# Patient Record
Sex: Male | Born: 1965 | Race: White | Hispanic: No | Marital: Married | Smoking: Never smoker
Health system: Southern US, Community
[De-identification: ages and names within clinical notes are randomized; demographics above are authoritative.]

## PROBLEM LIST (undated history)

## (undated) DIAGNOSIS — F419 Anxiety disorder, unspecified: Secondary | ICD-10-CM

## (undated) DIAGNOSIS — F32A Depression, unspecified: Secondary | ICD-10-CM

## (undated) DIAGNOSIS — M1990 Unspecified osteoarthritis, unspecified site: Secondary | ICD-10-CM

## (undated) DIAGNOSIS — F329 Major depressive disorder, single episode, unspecified: Secondary | ICD-10-CM

## (undated) DIAGNOSIS — K219 Gastro-esophageal reflux disease without esophagitis: Secondary | ICD-10-CM

## (undated) HISTORY — PX: OTHER SURGICAL HISTORY: SHX169

## (undated) HISTORY — PX: COLONOSCOPY: SHX174

## (undated) HISTORY — DX: Unspecified osteoarthritis, unspecified site: M19.90

## (undated) HISTORY — DX: Major depressive disorder, single episode, unspecified: F32.9

## (undated) HISTORY — DX: Gastro-esophageal reflux disease without esophagitis: K21.9

## (undated) HISTORY — DX: Depression, unspecified: F32.A

---

## 1998-10-27 ENCOUNTER — Encounter: Payer: Self-pay | Admitting: Emergency Medicine

## 1998-10-27 ENCOUNTER — Emergency Department (HOSPITAL_COMMUNITY): Admission: EM | Admit: 1998-10-27 | Discharge: 1998-10-27 | Payer: Self-pay | Admitting: Emergency Medicine

## 2002-03-06 ENCOUNTER — Encounter: Payer: Self-pay | Admitting: Emergency Medicine

## 2002-03-06 ENCOUNTER — Emergency Department (HOSPITAL_COMMUNITY): Admission: EM | Admit: 2002-03-06 | Discharge: 2002-03-06 | Payer: Self-pay | Admitting: Emergency Medicine

## 2007-01-11 ENCOUNTER — Emergency Department (HOSPITAL_COMMUNITY): Admission: EM | Admit: 2007-01-11 | Discharge: 2007-01-11 | Payer: Self-pay | Admitting: Emergency Medicine

## 2008-06-03 ENCOUNTER — Emergency Department (HOSPITAL_COMMUNITY): Admission: EM | Admit: 2008-06-03 | Discharge: 2008-06-04 | Payer: Self-pay | Admitting: Emergency Medicine

## 2008-06-04 ENCOUNTER — Inpatient Hospital Stay (HOSPITAL_COMMUNITY): Admission: AD | Admit: 2008-06-04 | Discharge: 2008-06-05 | Payer: Self-pay | Admitting: *Deleted

## 2008-06-07 ENCOUNTER — Ambulatory Visit: Payer: Self-pay | Admitting: *Deleted

## 2008-07-02 ENCOUNTER — Observation Stay (HOSPITAL_COMMUNITY): Admission: EM | Admit: 2008-07-02 | Discharge: 2008-07-05 | Payer: Self-pay | Admitting: Emergency Medicine

## 2008-07-05 ENCOUNTER — Inpatient Hospital Stay (HOSPITAL_COMMUNITY): Admission: RE | Admit: 2008-07-05 | Discharge: 2008-07-07 | Payer: Self-pay | Admitting: *Deleted

## 2008-07-05 ENCOUNTER — Ambulatory Visit: Payer: Self-pay | Admitting: *Deleted

## 2010-06-03 ENCOUNTER — Emergency Department (HOSPITAL_COMMUNITY): Admission: EM | Admit: 2010-06-03 | Discharge: 2010-06-03 | Payer: Self-pay | Admitting: Emergency Medicine

## 2010-11-25 ENCOUNTER — Emergency Department (HOSPITAL_BASED_OUTPATIENT_CLINIC_OR_DEPARTMENT_OTHER)
Admission: EM | Admit: 2010-11-25 | Discharge: 2010-11-25 | Payer: Self-pay | Source: Home / Self Care | Admitting: Emergency Medicine

## 2011-01-24 ENCOUNTER — Emergency Department (HOSPITAL_COMMUNITY)
Admission: EM | Admit: 2011-01-24 | Discharge: 2011-01-24 | Disposition: A | Discharge status: Home / Self Care | Payer: Medicare Other | Attending: Emergency Medicine | Admitting: Emergency Medicine

## 2011-01-24 ENCOUNTER — Emergency Department (HOSPITAL_COMMUNITY): Payer: Medicare Other

## 2011-01-24 DIAGNOSIS — R0602 Shortness of breath: Secondary | ICD-10-CM | POA: Insufficient documentation

## 2011-01-24 DIAGNOSIS — F3130 Bipolar disorder, current episode depressed, mild or moderate severity, unspecified: Secondary | ICD-10-CM | POA: Insufficient documentation

## 2011-01-24 DIAGNOSIS — R61 Generalized hyperhidrosis: Secondary | ICD-10-CM | POA: Insufficient documentation

## 2011-01-24 DIAGNOSIS — Z79899 Other long term (current) drug therapy: Secondary | ICD-10-CM | POA: Insufficient documentation

## 2011-01-24 DIAGNOSIS — R209 Unspecified disturbances of skin sensation: Secondary | ICD-10-CM | POA: Insufficient documentation

## 2011-01-24 DIAGNOSIS — R079 Chest pain, unspecified: Secondary | ICD-10-CM | POA: Insufficient documentation

## 2011-01-24 LAB — CBC
HCT: 42.6 % (ref 39.0–52.0)
Hemoglobin: 15.4 g/dL (ref 13.0–17.0)
MCV: 89.1 fL (ref 78.0–100.0)
RDW: 13.2 % (ref 11.5–15.5)
WBC: 6.8 10*3/uL (ref 4.0–10.5)

## 2011-01-24 LAB — COMPREHENSIVE METABOLIC PANEL
ALT: 85 U/L — ABNORMAL HIGH (ref 0–53)
Alkaline Phosphatase: 71 U/L (ref 39–117)
BUN: 12 mg/dL (ref 6–23)
CO2: 25 mEq/L (ref 19–32)
Chloride: 101 mEq/L (ref 96–112)
GFR calc non Af Amer: >60 mL/min (ref >60)
Glucose, Bld: 100 mg/dL — ABNORMAL HIGH (ref 70–99)
Potassium: 4.5 mEq/L (ref 3.5–5.1)
Sodium: 137 mEq/L (ref 135–145)
Total Bilirubin: 0.7 mg/dL (ref 0.3–1.2)
Total Protein: 7 g/dL (ref 6.0–8.3)

## 2011-01-24 LAB — DIFFERENTIAL
Eosinophils Relative: 3 % (ref 0–5)
Lymphocytes Relative: 41 % (ref 12–46)
Lymphs Abs: 2.8 10*3/uL (ref 0.7–4.0)
Monocytes Absolute: 0.6 10*3/uL (ref 0.1–1.0)
Neutro Abs: 3.3 10*3/uL (ref 1.7–7.7)

## 2011-01-24 LAB — POCT CARDIAC MARKERS
Myoglobin, poc: 103 ng/mL (ref 12–200)
Troponin i, poc: <0.05 ng/mL (ref 0.00–0.09)

## 2011-01-24 MED ORDER — IOHEXOL 350 MG/ML SOLN: 75.0000 mL | Freq: Once | INTRAVENOUS | Status: DC | PRN

## 2011-03-09 ENCOUNTER — Emergency Department (HOSPITAL_COMMUNITY)
Admission: EM | Admit: 2011-03-09 | Discharge: 2011-03-09 | Disposition: A | Discharge status: Home / Self Care | Payer: Medicare Other | Attending: Emergency Medicine | Admitting: Emergency Medicine

## 2011-03-09 ENCOUNTER — Emergency Department (HOSPITAL_COMMUNITY): Payer: Medicare Other

## 2011-03-09 DIAGNOSIS — R51 Headache: Secondary | ICD-10-CM | POA: Insufficient documentation

## 2011-03-09 DIAGNOSIS — S0003XA Contusion of scalp, initial encounter: Secondary | ICD-10-CM | POA: Insufficient documentation

## 2011-03-09 DIAGNOSIS — S0993XA Unspecified injury of face, initial encounter: Secondary | ICD-10-CM | POA: Insufficient documentation

## 2011-03-09 DIAGNOSIS — Z79899 Other long term (current) drug therapy: Secondary | ICD-10-CM | POA: Insufficient documentation

## 2011-03-09 DIAGNOSIS — Y990 Civilian activity done for income or pay: Secondary | ICD-10-CM | POA: Insufficient documentation

## 2011-03-09 DIAGNOSIS — R220 Localized swelling, mass and lump, head: Secondary | ICD-10-CM | POA: Insufficient documentation

## 2011-03-09 DIAGNOSIS — Y9229 Other specified public building as the place of occurrence of the external cause: Secondary | ICD-10-CM | POA: Insufficient documentation

## 2011-03-09 DIAGNOSIS — F3130 Bipolar disorder, current episode depressed, mild or moderate severity, unspecified: Secondary | ICD-10-CM | POA: Insufficient documentation

## 2011-04-09 NOTE — Discharge Summary (Signed)
[name] Jenkins, [name] Jenkins                ACCOUNT NO.:  [id number]   MEDICAL RECORD NO.:  [id number]          PATIENT TYPE:  INP   LOCATION:  1514                         FACILITY:  [facility]   PHYSICIAN:  Peggye Pitt, M.D. DATE OF BIRTH:  1966/11/18   DATE OF ADMISSION:  07/02/2008  DATE OF DISCHARGE:  07/05/2008                               DISCHARGE SUMMARY   DISCHARGE DIAGNOSES:  1. Altered mental status secondary to intentional drug overdose.  2. Suicide attempt by the way of intentional drug overdose of      Seroquel, Valium and Ambien.  3. Alcohol abuse.   DISCHARGE MEDICATIONS:  To be determined by inpatient Psychiatry.   DISPOSITION:  The patient will be transferred to [facility] for inpatient psychiatric treatment.   CONSULTATIONS:  None.   PROCEDURE:  A chest x-ray on July 02, 2008 that shows minimal bibasilar  atelectasis.   HISTORY AND PHYSICAL EXAM:  For full details, please refer to the chart,  but in brief [name] Jenkins is a 45 year old Caucasian man who took an  overdose of Valium, Ambien and Seroquel.  His wife had kicked him out of  the house that day.  They have been having some marital problems and he  was very depressed about that.  He also lost his  job which is another  added stressor.  All of this lead to his intentional drug overdose.   HOSPITAL COURSE:  1. Altered mental status, likely secondary to his drug overdose:      Resolved at time of discharge.  2. Suicide attempt by the way of intentional drug overdose of Valium,      Ambien and Seroquel:  He has had no further complications from      this.  He is medically cleared for discharge.  We had requested a      Psychiatry evaluation and they have recommended inpatient      psychiatric admission to the [facility] for further      evaluation and treatment as they do not feel he would be safe if he      were to be discharged home.  Other than his psychiatric issues, no  other problems were active during this hospitalization.   DISCHARGE VITAL SIGNS:  Blood pressure 116/65, heart rate 71,  respirations 20, 02 saturation 93% on room air with a temperature of  97.9.      Peggye Pitt, M.D.  Electronically Signed     EH/MEDQ  D:  07/05/2008  T:  07/05/2008  Job:  [id number]

## 2011-04-09 NOTE — H&P (Signed)
[name] Jenkins, AXEL                ACCOUNT NO.:  [id number]   MEDICAL RECORD NO.:  [id number]          PATIENT TYPE:  INP   LOCATION:  1236                         FACILITY:  [facility]   PHYSICIAN:  Herbie Saxon, MDDATE OF BIRTH:  [birth year]   DATE OF ADMISSION:  07/02/2008  DATE OF DISCHARGE:  06/05/2008                              HISTORY & PHYSICAL   PRESENTING COMPLAINTS:  1. Reduced level of consciousness.  2. Intentional drug overdose.  3. Alcohol abuse.  4. Garbled speech 1 day.   HISTORY OF PRESENTING COMPLAINT:  This is a 45 year old male with past  history of psychiatric disease, Dilaudid dependence, anxiety,  personality disorder, who was recently discharged from inpatient  psychiatry care on June 05, 2008 after intentional Xanax and Seroquel  overdose of June 04, 2008.  Was brought in by EMT and the sheriff early  this morning.  The patient had been drinking gin and he took an  unspecified number of pills, likely Seroquel and benzodiazepine.  He  left a suicide note.  His speech was garbled, but the patient was  verbalizing the intention to harm himself.  Presently obtunded and  unable to give any history.  Family members are not able to add or  corroborate any more history.  Old chart and ER chart reviewed.   PAST MEDICAL HISTORY:  As stated above.   PAST SURGICAL HISTORY:  Not available.   SOCIAL HISTORY:  He lives with relatives.  Documented history of  domestic stress with his wife.   REVIEW OF SYSTEMS:  Not available.   ALLERGIES:  BUSPAR.   MEDICATIONS:  1. Seroquel 50 mg daily.  2. Diazepam 10 mg q.6 h.  3. Alprazolam 2 mg p.r.n.  4. Seroquel 100 mg at night.  5. Zolpidem 1 tablet at night p.r.n.   PHYSICAL EXAMINATION:  GENERAL:  He is a young man poorly arousable to  noxious stimuli.  HEENT:  Pupils are constricted, sluggishly reactive.  Head is  atraumatic, normocephalic.  Oropharynx and pharynx are clear.  VITAL SIGNS:  Temperature 98,  pulse 96, respiratory rate 11, blood  pressure 117/72.  NECK:  Supple.  No adenopathy or JVD.  CHEST:  Clinically clear.  No rales or rhonchi.  HEART:  Sounds 1 and 2, regular rate and rhythm.  No murmurs, rubs or  gallops.  ABDOMEN:  Soft, nontender.  No organomegaly.  Inguinal orifices are  patent.  EXTREMITIES:  Deep tendon reflexes are 2 globally.  Generalized  hypertonia.  Babinski's upgoing.  No pedal edema.  No erythema.  No  cyanosis or clubbing.  NEURO:  Cranial nerves and sensory could not be tested.   LABORATORY DATA:  AST 25, ALT 22, albumin 0.9, alcohol less than 5.  Urine tox positive for benzodiazepines.   DIAGNOSTICS:  EKG:  Normal sinus rhythm at 87 per minute.   ASSESSMENT:  1. Altered mental status.  2. Suicide attempt.  3. Substance abuse.  4. Alcohol abuse.  5. Seroquel overdose.   PLAN:  The patient will be admitted to the Intensive Care Unit.  He is  to be on suicide precautions, 1:1 observation sitter, fall, aspiration,  seizure precautions, watch for DTs, oxygen 2-4 liters nasal cannula  p.r.n. supplementally, n.p.o. for now, IV fluid D5 NSat an hour  with thiamine and folate added to IV fluid.  Check his BMP, calcium,  magnesium, phosphorus, coagulation parameters, urinalysis, chest x-ray.  He is to be on Lovenox 40 mg subcutaneous daily for DVT prophylaxis,  Continue protonix 40mg  IV daily, Proventil 1 unit dose q.6 h p.r.n.  Psych evaluation for psych placement. EKG q.6 h. x2.  substance abuse  counseling when patient is able.   PRIMARY CARE PHYSICIAN:  Dr. Tally Due, MD  Electronically Signed     MIO/MEDQ  D:  07/02/2008  T:  07/02/2008  Job:  [id number]

## 2011-04-09 NOTE — Discharge Summary (Signed)
NAMECYRUS, [name] Jenkins                ACCOUNT NO.:  [id number]   MEDICAL RECORD NO.:  [id number]          PATIENT TYPE:  IPS   LOCATION:  0304                          [facility]  [facility]   PHYSICIAN:  Jasmine Pang, M.D. DATE OF BIRTH:  09-Dec-1965   DATE OF ADMISSION:  07/05/2008  DATE OF DISCHARGE:  07/07/2008                               DISCHARGE SUMMARY   IDENTIFICATION:  This is a 45 year old married white male who was  admitted on a voluntary basis on July 05, 2008.   HISTORY OF PRESENT ILLNESS:  The patient presents after an intentional  overdose on Seroquel, Xanax, and Valium tablets, he took approximately  six 50 mg Valium tablets and an unclear amount of Seroquel and 8 or 9  Xanax tablets and some Ambien.  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  His intention was to kill himself.  He  was having conflict with his wife.  They have been separated for about a  month after the patient had an affair.  The patient wants things to be  fixed with his wife sooner rather than later.  He states he was not  invited to his son's birthday party, which got him upset and sad and was  one of the triggers to his overdose.  He is currently denying any  suicidal thoughts.  He is glad things work down so that he was found.  This occurred when the police found his car behind the restaurant and  got, took him, are called the EMS or EMTs.  He is still very anxious  about trying to repair things with his wife.  He has been sleeping  otherwise well on his medications.  He denies any current alcohol or  drug use.  The patient was fully assessed at [facility] where  he was hospitalized for approximately 4 days until medically stable.   PAST PSYCHIATRIC HISTORY:  The patient was here in June 2009 for an  overdose on Xanax and Seroquel.  He states it was an accidental  overdose.  He sees  Dr. Evelene Croon for outpatient psychiatric services.  He  also has a therapist named Elita Quick in the past.  The patient has  been on Mefoxin and Cymbalta, which he states have both worsened his  mood.   FAMILY HISTORY:  The patient's mother has anxiety.   ALCOHOL AND DRUG HISTORY:  The patient denies any recent alcohol or drug  use.   MEDICAL PROBLEMS:  There were no acute or chronic health issues.   MEDICATIONS:  1. He has been on Seroquel 500 mg p.o. q.h.s.  2. Valium 10 mg t.i.d.  3. Ambien 10 mg q.h.s. p.r.n. insomnia.  4. Xanax 2 mg, he can take up to 30 in a month.   DRUG ALLERGIES:  No known drug allergies.   PHYSICAL FINDINGS:  There were no acute physical or medical problems  noted.  He was fully assessed at [facility]  where he was  admitted after his overdose.   ADMISSION LABORATORIES:  Laboratory data shows an alcohol level less  than 5.  Salicylate level less than 4.  Acetaminophen level less than  10.  Urine drug screen was positive for benzodiazepines.  Liver enzymes  were within normal limits.  Potassium 3.4.   HOSPITAL COURSE:  Upon admission, the patient was started on trazodone  100 mg p.o. q.h.s. p.r.n. insomnia.  He was also started on Librium 25  mg p.o. q.6 hours p.r.n. symptoms of withdrawal (instead of placing him  back on the Valium).  He was also placed on ibuprofen 800 mg p.o. t.i.d.  p.r.n. pain.  On July 05, 2008, he was started on Ambien 10 mg p.o.  q.h.s. p.r.n. insomnia and Seroquel 400 mg p.o. q.h.s.  The patient  tolerated these medications well with no significant side effects.  The  patient discussed his regret.  In individual sessions with me, the  patient was friendly and cooperative.  He also attended unit therapeutic  groups and activities.  The patient discussed his regrets and having the  affair.  He is very anxious for his wife to take him back, but aware  that she is going to need sometime.  He states he took the overdose  due  to separation from his wife and not being invited to his son's birthday  party.  This is the second hospitalization here for the patient.  He was  here 4 to 5 weeks ago.  He has been depressed and anxious.  On July 07, 2008, mental status had improved markedly from admission status.  The patient's mood was less depressed, less anxious.  Affect was  consistent with mood.  There was no suicidal or homicidal ideation.  No  thoughts of self-injurious behavior.  No auditory or visual  hallucinations.  No paranoia or delusions.  Thoughts were logical and  goal-directed.  Thought content.  No predominant theme.  Cognitive was  grossly intact.  Judgment was good.  Insight was good.  Impulse level  was within normal limits.  It was felt the patient was ready for  discharge today and the patient stated he was safe and had no intention  of trying to harm himself.   DISCHARGE DIAGNOSES:  Axis I:  Mood disorder, not otherwise specified.  Axis II:  Features of personality disorder, not otherwise specified.  Axis III:  No known medical conditions.  He is status post overdose on  Seroquel and benzodiazepines, but medically stable now.  Axis IV: Severe  (problems with his primary support group including separation from his  wife and alienation from many of his family members after his affair,  burden of psychiatric illness, occupational since he worked at [employer] own by his wife before they separated).  Axis V: Global assessment of functioning was 50 upon discharge.  GAF was  35 upon admission.  GAF highest past year was 65.   DISCHARGE PLANS:  There was no specific activity level or dietary  restrictions.   POSTHOSPITAL CARE PLANS:  The patient will see Dr. Evelene Croon, his  psychiatrist on August 14th at 9 a.m.  He will also see his therapist  Elita Quick, she is going to call him to schedule the appointment.   DISCHARGE MEDICATIONS:  1. Seroquel XR 400 mg at 7 p.m.  2. Ambien 10 mg at  bedtime p.r.n. insomnia.  3. He was requested to speak with Dr. Evelene Croon about restarting the  Valium      and Xanax and Ambien since she had been detoxed off these during      the hospitalization.      Jasmine Pang, M.D.  Electronically Signed     BHS/MEDQ  D:  07/07/2008  T:  07/07/2008  Job:  [id number]

## 2011-04-09 NOTE — H&P (Signed)
[name] Jenkins, SWOPES                ACCOUNT NO.:  [id number]   MEDICAL RECORD NO.:  [id number]          PATIENT TYPE:  IPS   LOCATION:  0600                          [facility]ACILITY:  [facility]   PHYSICIAN:  Jasmine Pang, M.D. DATE OF BIRTH:  04/13/1966   DATE OF ADMISSION:  06/04/2008  DATE OF DISCHARGE:                       PSYCHIATRIC ADMISSION ASSESSMENT   This is a voluntary admission to the services of Dr. Milford Cage.   IDENTIFYING INFORMATION:  This is a 45 year old, married, white male.  The [facility] Emergency Department stated that per EMS the patient had  an intentional overdose of 25, 2 mg Xanax, 10 Seroquel; these were 50 mg  apiece; five 10 mg Valium, and this was after having a domestic dispute  with his wife.  There was no prior suicidal ideation and there has been  no prior attempt at overdose.   PAST PSYCHIATRIC HISTORY:  The patient is currently under the care of  Dr. Milagros Evener.  About a year or so ago, he had a back injury.  He  was started initially on Dilaudid and Valium.  After about 3 months, he  realized he was actually becoming dependent on the Dilaudid meaning that  he would take it even though he was not having pain.  He discussed this  with the prescriber, who immediately stopped the Dilaudid and just gave  him ibuprofen.  A few weeks later, he felt very anxious and actually  having withdrawal, and he was seen in his primary care physician's  office, Dr. Herb Grays, started on Luvox, and after about a month he  felt depressed so they had to stop that.  He went through a variety of  med changes and currently has had the best response to Seroquel and  occasional Xanax.  He started out at 50 mg at night and this has  gradually been worked up to 350 mg at h.s., and he has Xanax 2 mg b.i.d.  that he uses occasionally.  He also has Ambien 10 mg at h.s.  All of  this is prescribed by Dr. Collins Scotland.  Back in June, Dr. Evelene Croon re-prescribed  Valium 10 mg q.i.d.  The  patient states that he was imprisoned at age  22, he had to go to prison for 15 years, and Dr. Evelene Croon feels that he will  likely as not need to take the Valium and the Seroquel indefinitely.   SOCIAL HISTORY:  He is a high Garment/textile technologist in 1986.  This is his  third marriage.  He has been in this marriage for 2 years.  He has 5  biological children, a son 15, a daughter 85 from his first marriage.  From his second marriage, a 71-year-old son, a 23-year-old daughter.  He  has 50% custody of these children.  He has a 17-year-old son by his  present wife.  He is employed as a Electrical engineer.   FAMILY HISTORY:  He denies.   ALCOHOL AND DRUG HISTORY:  He denies.   PRIMARY CARE Annalyn Blecher:  Dr. Herb Grays, and he is currently under  psychiatric care with Dr.  Evelene Croon and also sees a therapist.   MEDICAL PROBLEMS:  He denies at this time.   MEDICATIONS:  He is currently prescribed:  1. Ambien 10 mg p.o. at h.s.  2. Seroquel, he is now up to 350 mg at h.s.  3. Xanax 2 mg, one p.o. b.i.d. p.r.n.  4. Valium 10 mg p.o. q.i.d.  5. Ibuprofen 800 mg t.i.d. p.r.n.   He has a DRUG ALLERGY TO BUSPAR.  He stated it made his left hand go  numb.   POSITIVE PHYSICAL FINDINGS:  His EKG did show tachycardia.  His rate was  110 with a PR interval of 132.  He states he has had a TSH checked  recently, but we will go ahead and recheck that.  He had no other  untoward physical findings.  His UDS was positive for benzos only, which  he overdosed on, and he had no alcohol on board.  On admission, his  vital signs show he is 71 inches tall, he weighs 211, his temperature is  97.7, blood pressure is 123/79 to 129/83, and respirations are  __________.   MENTAL STATUS EXAM:  He is alert and oriented.  He is appropriately  groomed, dressed, and nourished.  He is in unusually good condition.  His speech is soft and slow.  His mood is appropriately reserved to the  situation.  His affect has a normal range.  Thought  processes are clear,  rational, and goal oriented.  He would like to be discharged.  Judgment  and insight are intact.  Concentration and memory are intact.  Intelligence is at least average.  He denies suicidal or homicidal  ideation.  He denies auditory or visual hallucinations.   DIAGNOSES:   AXIS I:  1. Adjustment disorder with disturbance of emotions and conduct.  2. 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   AXIS II:  Rule out personality disorder.  This is third marriage and  also had to be imprisoned for second-degree murder.   AXIS III:  Sinus tachycardia.   AXIS IV:  Problems with current wife.   AXIS V:  Thirty-six.   The plan is to admit for further stabilization.  He was seen in  consultation with Dr. Katrinka Blazing and she is agreeable to just continuing with  his current medication regime.  The patient is requesting discharge and  we told him we will have a family session hopefully tomorrow with his  wife and if everybody is agreeable that this was just an impulsive act  after a domestic argument, we can consider discharge tomorrow.      Mickie Leonarda Salon, P.A.-C.      Jasmine Pang, M.D.  Electronically Signed    MD/MEDQ  D:  06/04/2008  T:  06/04/2008  Job:  [id number]

## 2011-04-09 NOTE — H&P (Signed)
NAMEARDEAN, [name] NO.:  [id number]   MEDICAL RECORD NO.:  [id number]          PATIENT TYPE:  IPS   LOCATION:  0304                          [facility]ACILITY:  [facility]   PHYSICIAN:  Jasmine Pang, M.D. DATE OF BIRTH:  July 02, 1966   DATE OF ADMISSION:  07/05/2008  DATE OF DISCHARGE:                       PSYCHIATRIC ADMISSION ASSESSMENT   This is a 45 year old male voluntarily admitted on July 05, 2008.   HISTORY OF PRESENT ILLNESS:  The patient presents after intentional  overdose on Seroquel, Xanax and Valium tablets, taking approximately 50  Valium tablets, an  unclear amount of Seroquel, and took about 8 or 9  Xanax tablets and Ambien.  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  His intention was to kill himself.  He was having  conflict with his wife.  They have been separated for about a month  after the patient had an affair.  The patient wants things to be fixed  with his wife sooner rather than later.  He states he was not invited to  his son's birthday party, which got him upset and sad and one of the  triggers to the overdose.  He is currently denying any suicidal  thoughts.  He is glad things worked out.  He is still very anxious about  trying to repair things with his wife.  He has been sleeping otherwise  well on his medications.  Denies any current alcohol or drug use.  The  patient was fully assessed at [facility], where he was hospitalized for  approximately 4 days until medically stable.   PAST PSYCHIATRIC HISTORY:  The patient was here in June 2009 for an  overdose on Xanax and Seroquel.  He states it was an accidental  overdose.  Sees Dr. Lafayette Dragon for outpatient mental health services, also has  a therapist named Elita Quick.  In the past, the patient has been on  Luvox and Cymbalta,  which he states have both worsened his mood.   SOCIAL HISTORY:  He is a  45 year old male, currently separated.  His is  living with his sister at this time.  He works doing Office manager work.  He  has finished high school.   FAMILY HISTORY:  Mother with anxiety.   ALCOHOL AND DRUG HISTORY:  Denies any recent alcohol or drug use.   PRIMARY CARE Colman Birdwell:  Tammy R. Collins Scotland, M.D.   MEDICAL PROBLEMS:  The patient is a well-nourished, well-developed male  who denies any acute or chronic health issues.   MEDICATIONS:  He has been on Seroquel 500 mg, Valium 10 mg t.i.d.,  Ambien 10 q.h.s. p.r.n. for sleep, Xanax 2 mg, can take up to 30 in a  month.   DRUG ALLERGIES:  No known allergies.   PHYSICAL EXAMINATION:  This again is a healthy-appearing male with no  acute distress.  He was again assessed as medically stable, considered  medically stable at  [facility].  Initially the patient was  obtunded, unable to give  any history.  Speech garbled.  Again today he  is healthy and denies any complaints and appears in no acute distress.   His laboratory data shows an alcohol level less than 5, salicylate level  less than 4, acetaminophen level less than 10.  Urine drug screen is  positive for benzodiazepines.  Liver enzymes were within normal limits.  Potassium of 3.4.  Temperature 96.8, heart rate 84, respirations 18,  blood pressure is 134/84.  He is 6 feet, 2 inches tall, 208 pounds.   MENTAL STATUS EXAM:  This is a fully alert, cooperative male.  Good eye  contact.  He is neat in appearance.  Appropriately dressed.  Speech is  clear, normal pace and tone.  The patient's mood is sad.  The patient  does appear very sad, talking about his circumstances with his wife.  The patient's mood is also remorseful and guilty.  Thought processes are  coherent.  No evidence of psychosis.  Denies any suicidal thoughts.  Cognitive function is intact.  Memory is good.  Judgment and insight  appear to be good.  He appears sincere.   AXIS I:  Mood disorder, rule out major  depressive disorder versus  bipolar disorder, depressed.  AXIS II:  Deferred.  AXIS III:  No known medical conditions.  He is status post overdose on  Seroquel and benzodiazepines.  AXIS IV:  Problems with primary support group.  AXIS V:  Current is 35.   PLAN:  Stabilize mood and thinking.  Contract for safety.  We will  discontinue the patient's Valium and Xanax at this time.  The patient  will be on  Librium p.r.n.  That was discussed with the patient.  We  will continue with the patient's Seroquel as he has received good  benefits from the medication.  We will try to contact his wife for  possibly returning back to the home, any other concerns.  The patient  will need to follow up with Dr. Lafayette Dragon and his therapist as advised.  Tentative length of stay is 3 to 5 days.      Landry Corporal, N.P.      Jasmine Pang, M.D.  Electronically Signed    JO/MEDQ  D:  07/06/2008  T:  07/06/2008  Job:  [id number]

## 2011-04-09 NOTE — Consult Note (Signed)
[name], [name]                ACCOUNT NO.:  [id number]   MEDICAL RECORD NO.:  [id number]          PATIENT TYPE:  INP   LOCATION:  1514                         FACILITY:  [facility]   PHYSICIAN:  Jasmine Pang, M.D. DATE OF BIRTH:  12-30-1965   DATE OF CONSULTATION:  07/03/2008  DATE OF DISCHARGE:                                 CONSULTATION   PRIORITY PSYCHIATRIC CONSULTATION NOTE   REASON FOR CONSULTATION:  Patient is a 45 year old, married, white male  from [place].  I was called to see him due to a suicide attempt by  overdose on alcohol and various pills.   HISTORY OF PRESENT ILLNESS:  The patient states I have a meltdown.  He  took an overdose of Valium, Ambien, and Seroquel.  He states his wife  had kicked him out of the house.  He says she caught him cheating on  her, and she made him leave the home.  He is not sure if there is hope  for the marriage.  He feels a lot of remorse now and is quite tearful.  He worries about his children given the separation with his wife.  He  states he does not take his psychiatric medicines like prescribed.  He  sees Dr. Evelene Croon for psychiatric management.  He has been on Seroquel,  Luvox, Cymbalta, and Abilify in the past.  He is currently on Seroquel  500 mg at 7 p.m.  He is also on Valium 10 mg p.o. q.i.d. p.r.n.  He has  a history of back pain from a fascial tear.  The patient states he meant  to die, but the police intervened and took him to the ED.  Another  stressor revolved around his loss of job at his [employer] after  the situation with his infidelity (which was with wife's niece).  Patient has a history of being in prison from 45 years old to 45 years  old.  He denies any current legal problems.  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  He has been working as a Optometrist in clubs since he  lost the job with his wife.   MENTAL STATUS EXAM:  Patient was friendly and cooperative with good eye  contact.  Speech was  normal rate and flow.  There was positive  psychomotor retardation.  Mood was depressed and anxious.  Affect sad,  tearful.  There was a positive suicidal ideation as per history of  present illness.  No homicidal ideation, no thoughts of self-injurious  behavior, no auditory or visual hallucinations, no paranoia or  delusions.  Thoughts were logical and goal directed.  Thought content,  no predominant theme.  Cognitive was grossly intact.  Judgment was fair,  insight fair.   ASSESSMENT:  1. A mood disorder, not otherwise specified (rule out major depression      versus bipolar disorder, depressed).  2. Recent suicidal attempt with intention to die.   RECOMMENDATION:  Patient has agreed to come over to [facility] for further evaluation and treatment.  I do not feel he  would be  safe if he was discharged home at this time.  Thank you for this  consult.   [phone])      Jasmine Pang, M.D.  Electronically Signed    BHS/MEDQ  D:  07/03/2008  T:  07/03/2008  Job:  [id number]

## 2011-04-09 NOTE — Discharge Summary (Signed)
NAMESTACEY, MAURA                ACCOUNT NO.:  [id number]   MEDICAL RECORD NO.:  [id number]          PATIENT TYPE:  IPS   LOCATION:  0600                          FACILITY:  [facility]   PHYSICIAN:  Jasmine Pang, M.D. DATE OF BIRTH:  Jul 16, 1966   DATE OF ADMISSION:  06/04/2008  DATE OF DISCHARGE:  06/05/2008                               DISCHARGE SUMMARY   IDENTIFICATION:  This is a 45 year old married white male who was  admitted on a voluntary basis on June 04, 2008.   HISTORY OF PRESENT ILLNESS:  The patient was brought to the [facility]  Emergency Department stating that per EMS, the patient had an  intentional overdose of 25 of 2 mg Xanax, 10 of 50 mg Seroquel, and 5 of  10 mg Valium.  This occurred after a domestic dispute with his wife.  There was no prior suicidal ideation.  There has been no prior attempt  at overdose.  The patient is currently under the care of Dr. Milagros Evener.  About a year or so ago, he had a back injury.  He was started  initially on Dilaudid and Valium.  After about 3 months, he realized he  was becoming dependent on the Dilaudid, meaning that he would take it  even though he was not having pain.  He discussed this with the  prescriber who immediately stopped Dilaudid and just gave him ibuprofen.  Few weeks later, he felt very anxious and actually was having  withdrawal.  He was seen by his primary care physician, Dr. Herb Grays,  and started on Luvox.  After about a month, he felt depressed, so they  had to stop that.  He went to a variety of other med changes and  currently has had the best response to Seroquel and occasional Xanax.  He started out with Seroquel 50 mg at night.  This has gradually been  increased up to 350 mg at h.s.  He has Xanax 2 mg p.o. b.i.d. that he  uses occasionally.  He also uses his Ambien 10 mg q.h.s.  All of this  was prescribed by Dr. Collins Scotland, back in June Dr. Evelene Croon represcribed Valium  10 mg q.i.d.  The patient states  that he was imprisoned at age 34.  He  had to go to prison for 15 years.  He states Dr. Evelene Croon feels he will not  likely need to take the Valium indefinitely.   PAST PSYCHIATRIC HISTORY:  As above.   FAMILY HISTORY:  The patient denies.   ALCOHOL AND DRUG HISTORY:  The patient denies.   MEDICAL PROBLEMS:  None.   MEDICATIONS:  1. Ambien 10 mg p.o. q.h.s.  2. Seroquel 350 mg q.h.s.  3. Xanax 2 mg 1 p.o. b.i.d. p.r.n.  4. Valium 10 mg p.o. q.i.d.  5. Ibuprofen 800 mg t.i.d. p.r.n.   ALLERGIES:  The patient has a drug allergy to BUSPAR.  He stated it made  his left hand to go numb.   PHYSICAL FINDINGS:  The patient had no acute medical or physical  problems noted on exam.  HOSPITAL COURSE:  Upon admission, the patient was started on Seroquel  350 mg at h.s., Ambien 10 mg at h.s., Valium 10 mg q.i.d., and ibuprofen  800 mg p.o. t.i.d.  He tolerated these medications well with no  significant side effects.  In individual sessions with me, he was  friendly and cooperative with good eye contact.  Speech was normal rate  and flow.  Psychomotor activity was within normal limits.  He also  participated appropriately in unit therapeutic groups and activities.  The patient admitted to an overdose after an argument with his wife.  He  states he has OCD traits.  He has been on Luvox, Cymbalta, BuSpar,  Xanax, and other meds.  Recently Seroquel, he has been using, has helped  him the most, he said it also helps stabilize his mood.  The dose has  slowly been increasing to 350 mg p.o. q.h.s. at this time.  On June 04, 2008, a family session was held with the patient and his wife.  The  patient's wife stated she did not want the patient to come home.  She  agreed to have him come home and she would leave if there is no other  place for him to go.  The patient stated he was surprised at this news  and stated he did not look into other options.  He agreed to call  supports to try to find a  place to stay.  The patient stated he no  longer felt suicidal and contracted for safety and agreed to contact  supports if these feelings should return.  The patient's wife stated she  felt the patient would not harm himself, but would continue to take and  overtakes his medications  if feeling sad.  On June 05, 2008, mental  status had improved markedly from admission status.  The patient was  less depressed and less anxious.  Affect was consistent with mood.  There was no suicidal or homicidal ideation.  No thoughts of self-  injurious behavior.  No auditory or visual hallucinations.  No paranoia  or delusions.  Thoughts were logical and goal-directed.  Thought  content, no predominant theme.  Cognitive was grossly back to baseline.  It was felt the patient was safe for discharge today.  He planned to go  stay with his sister.   DISCHARGE DIAGNOSES:  Axis I:  Mood disorder, not otherwise specified.  Axis II:  Features of personality disorder, not otherwise specified.  Axis III:  Sinus tachycardia.  Axis IV:  Severe (problems with current wife, problems with a place to  stay, burden of psychiatric illness).  Axis V:  Global assessment of functioning was 47 upon discharge.  GAF  was 36 upon admission.  GAF highest past year 65-70.   DISCHARGE PLANS:  There was no specific activity level or dietary  restrictions.   POSTHOSPITAL CARE PLANS:  The patient will see his pastor, Janne Napoleon,  on June 09, 2008.  He will also continue to see Dr. Evelene Croon for his  medication management.   DISCHARGE MEDICATIONS:  1. Valium 10 mg 4 times daily.  2. Ambien 10 mg at bedtime.  3. Seroquel 350 mg at bedtime.    He was advised to discontinue the Xanax that he was taking at home.      Jasmine Pang, M.D.  Electronically Signed     BHS/MEDQ  D:  06/05/2008  T:  06/06/2008  Job:  [id number]

## 2011-08-22 LAB — CBC
HCT: 48.6
Hemoglobin: 16.7
MCHC: 34.4
MCV: 92.8
RBC: 5.24
RDW: 13.3

## 2011-08-22 LAB — RAPID URINE DRUG SCREEN, HOSP PERFORMED
Amphetamines: NOT DETECTED
Barbiturates: NOT DETECTED
Cocaine: NOT DETECTED
Opiates: NOT DETECTED

## 2011-08-22 LAB — COMPREHENSIVE METABOLIC PANEL
ALT: 53
BUN: 13
CO2: 26
Calcium: 9.6
GFR calc non Af Amer: >60
Glucose, Bld: 93
Sodium: 138
Total Protein: 6.7

## 2011-08-22 LAB — URINALYSIS, ROUTINE W REFLEX MICROSCOPIC
Bilirubin Urine: NEGATIVE
Hgb urine dipstick: NEGATIVE
Ketones, ur: NEGATIVE
Specific Gravity, Urine: 1.009
pH: 6

## 2011-08-22 LAB — DIFFERENTIAL
Basophils Relative: 2 — ABNORMAL HIGH
Eosinophils Absolute: 0.1
Lymphs Abs: 2.4
Monocytes Relative: 6
Neutro Abs: 6.2
Neutrophils Relative %: 66

## 2011-08-23 LAB — CBC
HCT: 44.3
Hemoglobin: 15.3
RBC: 4.75
WBC: 8.1

## 2011-08-23 LAB — RAPID URINE DRUG SCREEN, HOSP PERFORMED
Amphetamines: NOT DETECTED
Barbiturates: NOT DETECTED
Benzodiazepines: POSITIVE — AB
Opiates: NOT DETECTED
Tetrahydrocannabinol: NOT DETECTED

## 2011-08-23 LAB — COMPREHENSIVE METABOLIC PANEL
ALT: 25
Alkaline Phosphatase: 61
BUN: 9
Chloride: 104
Glucose, Bld: 120 — ABNORMAL HIGH
Potassium: 3.9
Sodium: 138
Total Bilirubin: 1

## 2011-08-23 LAB — POCT I-STAT, CHEM 8
Chloride: 102
Glucose, Bld: 116 — ABNORMAL HIGH

## 2011-08-23 LAB — CARDIAC PANEL(CRET KIN+CKTOT+MB+TROPI)
CK, MB: 0.8
CK, MB: 0.8
Relative Index: 0.7
Total CK: 121
Troponin I: 0.02

## 2011-08-23 LAB — APTT: aPTT: 29

## 2011-08-23 LAB — DIFFERENTIAL
Basophils Absolute: 0
Basophils Relative: 0
Eosinophils Absolute: 0.1
Monocytes Absolute: 0.4
Monocytes Relative: 5
Neutro Abs: 4.7
Neutrophils Relative %: 59

## 2011-08-23 LAB — HEPATIC FUNCTION PANEL
ALT: 28
Alkaline Phosphatase: 67
Bilirubin, Direct: 0.1
Indirect Bilirubin: 0.8
Total Bilirubin: 0.9

## 2011-09-15 ENCOUNTER — Ambulatory Visit (HOSPITAL_BASED_OUTPATIENT_CLINIC_OR_DEPARTMENT_OTHER): Payer: Medicare Other | Attending: Family Medicine

## 2011-09-15 DIAGNOSIS — G4710 Hypersomnia, unspecified: Secondary | ICD-10-CM | POA: Insufficient documentation

## 2011-09-15 DIAGNOSIS — G4730 Sleep apnea, unspecified: Secondary | ICD-10-CM | POA: Insufficient documentation

## 2011-09-21 DIAGNOSIS — R0989 Other specified symptoms and signs involving the circulatory and respiratory systems: Secondary | ICD-10-CM

## 2011-09-21 DIAGNOSIS — G4733 Obstructive sleep apnea (adult) (pediatric): Secondary | ICD-10-CM

## 2011-09-21 DIAGNOSIS — R0609 Other forms of dyspnea: Secondary | ICD-10-CM

## 2011-09-21 NOTE — Procedures (Signed)
[name], [name] Jenkins                ACCOUNT NO.:  [id number]  MEDICAL RECORD NO.:  [id number]          PATIENT TYPE:  OUT  LOCATION:  SLEEP CENTER                 FACILITY:  [facility]  PHYSICIAN:  Clinton D. Maple Hudson, MD, FCCP, FACPDATE OF BIRTH:  1966-02-19  DATE OF STUDY:  09/15/2011                           NOCTURNAL POLYSOMNOGRAM  REFERRING PHYSICIAN:  Jamey Reas, MD  REFERRING DOCTOR:  Jamey Reas , MD  INDICATION FOR STUDY:  Hypersomnia with sleep apnea.  EPWORTH SLEEPINESS SCORE:  12/24.  BMI 33.9, weight 250 pounds, height 72 inches, neck 16 inches.  MEDICATIONS:  Home medications are charted and reviewed.  SLEEP ARCHITECTURE:  Total sleep time 348.5 minutes with sleep efficiency 87.5%.  Stage I was 7.2%, stage II 72.2%, stage III absent, REM 20.7% of total sleep time.  Sleep latency 18.5 minutes, REM latency 192 minutes, awake after sleep onset 31.5 minutes.  Arousal index 5.7.  BEDTIME MEDICATIONS:  Seroquel, Ambien.  RESPIRATORY DATA:  Apnea-hypopnea index (AHI) 5.7/hour.  A total of 33 events were scored, all as hypopneas associated with nonsupine sleep. REM AHI 6.7/hour.  There were insufficient numbers of events to qualify for application of CPAP titration by split protocol on this study night.  OXYGEN DATA:  Loud snoring with oxygen desaturation to a nadir of 85% and the mean oxygen saturation through the study of 91.4% on room air.  CARDIAC DATA:  Sinus rhythm with occasional PVC.  MOVEMENT-PARASOMNIA:  No significant movement disturbance.  No bathroom trips.  IMPRESSIONS-RECOMMENDATIONS:  Minimal obstructive sleep apnea/hypopnea syndrome, AHI 5.7/hour.  The normal range for adult is between 0 and 5 events per hour.  Loud snoring with oxygen desaturation to a nadir of 85% and the mean oxygen saturation through the study of 91.4% on room air.     Clinton D. Maple Hudson, MD, [facility], FACP Diplomate, Biomedical engineer of Sleep Medicine Electronically  Signed    CDY/MEDQ  D:  09/21/2011 10:48:36  T:  09/21/2011 11:00:35  Job:  [id number]

## 2015-09-27 ENCOUNTER — Other Ambulatory Visit: Payer: Self-pay | Admitting: Orthopedic Surgery

## 2015-09-27 DIAGNOSIS — M25519 Pain in unspecified shoulder: Secondary | ICD-10-CM

## 2015-10-03 ENCOUNTER — Other Ambulatory Visit: Payer: Medicare Other

## 2015-10-12 ENCOUNTER — Ambulatory Visit
Admission: RE | Admit: 2015-10-12 | Discharge: 2015-10-12 | Disposition: A | Discharge status: Home / Self Care | Payer: Medicare Other | Source: Ambulatory Visit | Attending: Orthopedic Surgery | Admitting: Orthopedic Surgery

## 2015-10-12 DIAGNOSIS — M25519 Pain in unspecified shoulder: Secondary | ICD-10-CM

## 2016-11-22 ENCOUNTER — Emergency Department (HOSPITAL_COMMUNITY)
Admission: EM | Admit: 2016-11-22 | Discharge: 2016-11-22 | Disposition: A | Discharge status: Home / Self Care | Payer: Medicare Other | Attending: Emergency Medicine | Admitting: Emergency Medicine

## 2016-11-22 ENCOUNTER — Encounter (HOSPITAL_COMMUNITY): Payer: Self-pay

## 2016-11-22 ENCOUNTER — Emergency Department (HOSPITAL_COMMUNITY): Payer: Medicare Other

## 2016-11-22 DIAGNOSIS — R05 Cough: Secondary | ICD-10-CM | POA: Diagnosis not present

## 2016-11-22 DIAGNOSIS — R0602 Shortness of breath: Secondary | ICD-10-CM | POA: Diagnosis present

## 2016-11-22 DIAGNOSIS — R062 Wheezing: Secondary | ICD-10-CM | POA: Insufficient documentation

## 2016-11-22 DIAGNOSIS — R059 Cough, unspecified: Secondary | ICD-10-CM

## 2016-11-22 LAB — BASIC METABOLIC PANEL
Anion gap: 7 (ref 5–15)
BUN: 14 mg/dL (ref 6–20)
CALCIUM: 9.5 mg/dL (ref 8.9–10.3)
CHLORIDE: 98 mmol/L — AB (ref 101–111)
CO2: 33 mmol/L — ABNORMAL HIGH (ref 22–32)
CREATININE: 1.03 mg/dL (ref 0.61–1.24)
Glucose, Bld: 113 mg/dL — ABNORMAL HIGH (ref 65–99)
Potassium: 4.3 mmol/L (ref 3.5–5.1)
SODIUM: 138 mmol/L (ref 135–145)

## 2016-11-22 LAB — I-STAT TROPONIN, ED
TROPONIN I, POC: 0 ng/mL (ref 0.00–0.08)
TROPONIN I, POC: 0 ng/mL (ref 0.00–0.08)

## 2016-11-22 LAB — CBC
HCT: 46 % (ref 39.0–52.0)
Hemoglobin: 16 g/dL (ref 13.0–17.0)
MCH: 31.4 pg (ref 26.0–34.0)
MCHC: 34.8 g/dL (ref 30.0–36.0)
MCV: 90.4 fL (ref 78.0–100.0)
PLATELETS: 228 10*3/uL (ref 150–400)
RBC: 5.09 MIL/uL (ref 4.22–5.81)
RDW: 13 % (ref 11.5–15.5)
WBC: 12.3 10*3/uL — AB (ref 4.0–10.5)

## 2016-11-22 LAB — D-DIMER, QUANTITATIVE (NOT AT ARMC): D DIMER QUANT: 0.34 ug{FEU}/mL (ref 0.00–0.50)

## 2016-11-22 MED ORDER — SODIUM CHLORIDE 0.9 % IV BOLUS (SEPSIS)
1000.0000 mL | Freq: Once | INTRAVENOUS | Status: AC
  Administered 2016-11-22: 1000 mL via INTRAVENOUS

## 2016-11-22 MED ORDER — NAPROXEN 375 MG PO TABS: 375.0000 mg | ORAL_TABLET | Freq: Two times a day (BID) | ORAL | 0 refills | Status: AC | PRN

## 2016-11-22 MED ORDER — DIPHENHYDRAMINE HCL 50 MG/ML IJ SOLN
25.0000 mg | Freq: Once | INTRAMUSCULAR | Status: AC
  Administered 2016-11-22: 25 mg via INTRAVENOUS
  Filled 2016-11-22: qty 1

## 2016-11-22 MED ORDER — LEVOFLOXACIN 750 MG PO TABS: 750.0000 mg | ORAL_TABLET | Freq: Every day | ORAL | 0 refills | Status: AC

## 2016-11-22 MED ORDER — DEXAMETHASONE SODIUM PHOSPHATE 10 MG/ML IJ SOLN
10.0000 mg | Freq: Once | INTRAMUSCULAR | Status: AC
  Administered 2016-11-22: 10 mg via INTRAVENOUS
  Filled 2016-11-22 (×2): qty 1

## 2016-11-22 MED ORDER — MORPHINE SULFATE (PF) 4 MG/ML IV SOLN: 4.0000 mg | Freq: Once | INTRAVENOUS | Status: DC

## 2016-11-22 MED ORDER — LEVOFLOXACIN 750 MG PO TABS
750.0000 mg | ORAL_TABLET | Freq: Once | ORAL | Status: AC
  Administered 2016-11-22: 750 mg via ORAL
  Filled 2016-11-22: qty 1

## 2016-11-22 MED ORDER — HYDROCODONE-HOMATROPINE 5-1.5 MG/5ML PO SYRP: 5.0000 mL | ORAL_SOLUTION | ORAL | 0 refills | Status: DC | PRN

## 2016-11-22 MED ORDER — IPRATROPIUM-ALBUTEROL 0.5-2.5 (3) MG/3ML IN SOLN
3.0000 mL | Freq: Once | RESPIRATORY_TRACT | Status: AC
  Administered 2016-11-22: 3 mL via RESPIRATORY_TRACT
  Filled 2016-11-22: qty 3

## 2016-11-22 MED ORDER — HYDROCODONE-HOMATROPINE 5-1.5 MG/5ML PO SYRP
5.0000 mL | ORAL_SOLUTION | Freq: Once | ORAL | Status: AC
  Administered 2016-11-22: 5 mL via ORAL
  Filled 2016-11-22: qty 5

## 2016-11-22 MED ORDER — METOCLOPRAMIDE HCL 5 MG/ML IJ SOLN
10.0000 mg | Freq: Once | INTRAMUSCULAR | Status: AC
  Administered 2016-11-22: 10 mg via INTRAVENOUS
  Filled 2016-11-22: qty 2

## 2016-11-22 MED ORDER — KETOROLAC TROMETHAMINE 15 MG/ML IJ SOLN
15.0000 mg | Freq: Once | INTRAMUSCULAR | Status: AC
  Administered 2016-11-22: 15 mg via INTRAVENOUS
  Filled 2016-11-22: qty 1

## 2016-11-22 NOTE — ED Provider Notes (Signed)
[facility] DEPT Provider Note   CSN: [id number] Arrival date & time: 11/22/16  1338     History   Chief Complaint Chief Complaint  Patient presents with  . Shortness of Breath  . Cough    HPI [name] Jenkins is a 50 y.o. male.  HPI 50 year old male with no sniffing a past medical history here with cough for the last 3 weeks. Patient states his symptoms started approximately 3 weeks ago with cough, wheezing, and shortness of breath. He has been seen twice by his urgent care for this and prescribed a course of steroids as well as azithromycin. He just finished steroids yesterday. He notes minimal improvement on these as well as no improvement on steroids. He endorses a persistent, dry, hacking cough. He also notes that he becomes intermittently lightheaded after coughing spells and has had one episode of syncope due to a coughing spell. He endorses preceding lightheadedness and dizziness during a coughing spell prior to his brief, less than 5 second loss of consciousness. Denies any chest pain. No coronary history. He does have known sick contacts. No history of asthma or COPD and he denies any tobacco use  History reviewed. No pertinent past medical history.  There are no active problems to display for this patient.   History reviewed. No pertinent surgical history.     Home Medications    Prior to Admission medications   Medication Sig Start Date End Date Taking? Authorizing Provider  HYDROcodone-homatropine (HYCODAN) 5-1.5 MG/5ML syrup Take 5 mLs by mouth every 4 (four) hours as needed for cough. 11/22/16   Duffy Bruce, MD  levofloxacin (LEVAQUIN) 750 MG tablet Take 1 tablet (750 mg total) by mouth daily. 11/22/16 11/26/16  Duffy Bruce, MD  naproxen (NAPROSYN) 375 MG tablet Take 1 tablet (375 mg total) by mouth 2 (two) times daily as needed for moderate pain. 11/22/16 11/29/16  Duffy Bruce, MD    Family History History reviewed. No pertinent family history.  Social  History Social History  Substance Use Topics  . Smoking status: Never Smoker  . Smokeless tobacco: Never Used  . Alcohol use Yes     Comment: occ     Allergies   Patient has no known allergies.   Review of Systems Review of Systems  Constitutional: Positive for chills and fatigue. Negative for fever.  HENT: Negative for congestion and rhinorrhea.   Eyes: Negative for visual disturbance.  Respiratory: Positive for cough, shortness of breath and wheezing.   Cardiovascular: Negative for chest pain and leg swelling.  Gastrointestinal: Negative for abdominal pain, diarrhea, nausea and vomiting.  Genitourinary: Negative for dysuria and flank pain.  Musculoskeletal: Negative for neck pain and neck stiffness.  Skin: Negative for rash and wound.  Allergic/Immunologic: Negative for immunocompromised state.  Neurological: Positive for weakness (generalized). Negative for syncope and headaches.  All other systems reviewed and are negative.    Physical Exam Updated Vital Signs BP 154/98   Pulse 99   Temp 98.2 F (36.8 C) (Oral)   Resp 18   Ht 6' (1.829 m)   Wt 245 lb (111.1 kg)   SpO2 98%   BMI 33.23 kg/m   Physical Exam  Constitutional: He is oriented to person, place, and time. He appears well-developed and well-nourished. No distress.  HENT:  Head: Normocephalic and atraumatic.  Mouth/Throat: Oropharynx is clear and moist. No oropharyngeal exudate.  Eyes: Conjunctivae are normal.  Neck: Neck supple.  Cardiovascular: Normal rate, regular rhythm and normal heart sounds.  Exam reveals  no friction rub.   No murmur heard. Pulmonary/Chest: Effort normal. No respiratory distress. He has wheezes (mild, bilateral, clear with coughing). He has no rales.  Harsh, barking cough  Abdominal: Soft. He exhibits no distension.  Musculoskeletal: He exhibits no edema.  Neurological: He is alert and oriented to person, place, and time. He exhibits normal muscle tone.  Skin: Skin is warm.  Capillary refill takes less than 2 seconds.  Psychiatric: He has a normal mood and affect.  Nursing note and vitals reviewed.    ED Treatments / Results  Labs (all labs ordered are listed, but only abnormal results are displayed) Labs Reviewed  BASIC METABOLIC PANEL - Abnormal; Notable for the following:       Result Value   Chloride 98 (*)    CO2 33 (*)    Glucose, Bld 113 (*)    All other components within normal limits  CBC - Abnormal; Notable for the following:    WBC 12.3 (*)    All other components within normal limits  D-DIMER, QUANTITATIVE (NOT AT [facility]  Randolm Idol, ED  Randolm Idol, ED    EKG  EKG Interpretation  Date/Time:  Friday November 22 2016 13:44:30 EST Ventricular Rate:  117 PR Interval:  128 QRS Duration: 84 QT Interval:  322 QTC Calculation: 449 R Axis:   103 Text Interpretation:  Sinus tachycardia Rightward axis Borderline ECG Sicne last EKG, rate has increased Otherwise no significant change Confirmed by Ellender Hose MD, Lysbeth Galas [phone]) on 11/22/2016 5:53:32 PM       Radiology Dg Chest 2 View  Result Date: 11/22/2016 CLINICAL DATA:  Shortness of breath, chest pain, cough, congestion EXAM: CHEST  2 VIEW COMPARISON:  01/24/2011 FINDINGS: Heart and mediastinal contours are within normal limits. No focal opacities or effusions. No acute bony abnormality. IMPRESSION: No active cardiopulmonary disease. Electronically Signed   By: Rolm Baptise M.D.   On: 11/22/2016 14:46    Procedures Procedures (including critical care time)  Medications Ordered in ED Medications  sodium chloride 0.9 % bolus 1,000 mL (1,000 mLs Intravenous New Bag/Given 11/22/16 1951)  sodium chloride 0.9 % bolus 1,000 mL (0 mLs Intravenous Stopped 11/22/16 1956)  ketorolac (TORADOL) 15 MG/ML injection 15 mg (15 mg Intravenous Given 11/22/16 1808)  dexamethasone (DECADRON) injection 10 mg (10 mg Intravenous Given 11/22/16 1818)  HYDROcodone-homatropine (HYCODAN) 5-1.5 MG/5ML  syrup 5 mL (5 mLs Oral Given 11/22/16 1759)  levofloxacin (LEVAQUIN) tablet 750 mg (750 mg Oral Given 11/22/16 1758)  metoCLOPramide (REGLAN) injection 10 mg (10 mg Intravenous Given 11/22/16 1952)  diphenhydrAMINE (BENADRYL) injection 25 mg (25 mg Intravenous Given 11/22/16 1953)  ipratropium-albuterol (DUONEB) 0.5-2.5 (3) MG/3ML nebulizer solution 3 mL (3 mLs Nebulization Given 11/22/16 1949)     Initial Impression / Assessment and Plan / ED Course  I have reviewed the triage vital signs and the nursing notes.  Pertinent labs & imaging results that were available during my care of the patient were reviewed by me and considered in my medical decision making (see chart for details).  Clinical Course     50 year old male who presents with a 4 week history of cough, wheezing, and shortness of breath. Primary suspicion is ongoing viral URI with possible superimposed And postviral cough. However, patient has no history of wheezing and is initially very tachycardic so we will obtain d-dimer to evaluate for possible PE. Otherwise, EKG is nonischemic. His troponin is negative and I do not suspect ACS or CHF. His screening lab work shows  mild dehydration as well as leukocytosis, likely secondary to recent steroid use. Chest x-ray shows no focal pneumonia. Will give symptomatically treatment and follow-up d-dimer.  D-dimer is negative. Delta troponin is negative as well. Patient's symptoms are markedly improved following symptomatically treatment in the ED. Suspect persistent postviral cough. Will treat with additional course of antibiotics, stronger cough medicine, and outpatient follow-up. Encourage fluids at home.  Final Clinical Impressions(s) / ED Diagnoses   Final diagnoses:  Cough  Wheezing    New Prescriptions New Prescriptions   HYDROCODONE-HOMATROPINE (HYCODAN) 5-1.5 MG/5ML SYRUP    Take 5 mLs by mouth every 4 (four) hours as needed for cough.   LEVOFLOXACIN (LEVAQUIN) 750 MG TABLET     Take 1 tablet (750 mg total) by mouth daily.   NAPROXEN (NAPROSYN) 375 MG TABLET    Take 1 tablet (375 mg total) by mouth 2 (two) times daily as needed for moderate pain.     Duffy Bruce, MD 11/22/16 2022

## 2016-11-22 NOTE — ED Notes (Signed)
Care handoff to [place], [place]

## 2016-11-22 NOTE — ED Triage Notes (Signed)
Pt reports violent cough that causes him to have shortness of breath. Pt reports he became so dizzy from coughing on Monday that he had a syncopal episode. Pt reports he has been sick X4 weeks. Pt is also tachycardic in triage.

## 2016-12-12 ENCOUNTER — Institutional Professional Consult (permissible substitution): Payer: Medicare Other | Admitting: Pulmonary Disease

## 2017-01-16 ENCOUNTER — Ambulatory Visit (INDEPENDENT_AMBULATORY_CARE_PROVIDER_SITE_OTHER): Payer: Medicare Other | Admitting: Pulmonary Disease

## 2017-01-16 ENCOUNTER — Encounter: Payer: Self-pay | Admitting: Pulmonary Disease

## 2017-01-16 DIAGNOSIS — R0600 Dyspnea, unspecified: Secondary | ICD-10-CM

## 2017-01-16 DIAGNOSIS — K219 Gastro-esophageal reflux disease without esophagitis: Secondary | ICD-10-CM | POA: Insufficient documentation

## 2017-01-16 DIAGNOSIS — Z8659 Personal history of other mental and behavioral disorders: Secondary | ICD-10-CM | POA: Insufficient documentation

## 2017-01-16 DIAGNOSIS — M1990 Unspecified osteoarthritis, unspecified site: Secondary | ICD-10-CM | POA: Insufficient documentation

## 2017-01-16 NOTE — Progress Notes (Signed)
Subjective:    Patient ID: [name] Jenkins, male    DOB: 14-Apr-1966, 51 y.o.   MRN: [id number]  HPI Per documentation, patient has had 3 separate visits to her primary care physician's office with the last occurring on 11/29/16. Patient has been seen for persistent coughing, wheezing, and chest tightness with a clear sputum production. Per documentation patient's cough started acutely on 11/14/16 but in fact had been present for about 6 months prior To August 2017. Patient previously reported improvement with albuterol nebulized. Previously has also been treated with Tessalon Perles, and azithromycin, and prednisone taper. Patient has been treated with an albuterol inhaler as well as Breo. Breo was subsequently discontinued due to feeling that it was contributing to his symptoms. Patient has had coughing spells that have actually precipitated syncope. Patient has also been treated in the past with Levaquin and Hycodan cough syrup. The patient today reports that his cough started in November 2017. He reports he routinely catches a "cold" in August. He reports he was seen after about a week due to the persistence of his cough. He reports he was given IV fluids for dehydration along with a breathing treatment at that time. He reports his cough totally resolved but then recurred with increased severity. He reports he has had progressively worsening shortness of breath for at least a year. He reports his dyspnea occurs at rest as well as with exertion. He reports that his breathing was better with colder air. He denies any exacerbating factors. He reports his cough has now totally resolved. This is the first year he has had the need for recurrent treatment for a bronchitis. Denies any breathing problems as a child. Denies any seasonal allergies, sinus congestion, pressure, or drainage. He does report reflux a "few times" a week despite being compliant with Nexium. He does report some odynophagia at night particularly.  He does have excessive dry mouth and drinks fluids intermittently throughout the night. No oral ulcers on a recurrent basis. Did recently have an ulcer on his tongue. He does have some dry eyes as well. No recent chest tightness, pressure, or pain.   Review of Systems No rashes or bruising. He reports chronic pain in his left shoulder and right elbow. He does report stiffness I his hands in the morning that resolves after he takes a shower. No joint erythema. A pertinent 14 point review of systems is negative except as per the history of presenting illness.  No Known Allergies  Current Outpatient Prescriptions on File Prior to Visit  Medication Sig Dispense Refill  . HYDROcodone-homatropine (HYCODAN) 5-1.5 MG/5ML syrup Take 5 mLs by mouth every 4 (four) hours as needed for cough. (Patient not taking: Reported on 01/16/2017) 120 mL 0   No current facility-administered medications on file prior to visit.     Past Medical History:  Diagnosis Date  . Arthritis   . Depression   . GERD (gastroesophageal reflux disease)     Past Surgical History:  Procedure Laterality Date  . none      Family History  Problem Relation Age of Onset  . Lung disease Mother   . Lung cancer Father   . Emphysema Maternal Grandfather   . Heart disease Maternal Grandfather     Social History   Social History  . Marital status: Married    Spouse name: N/A  . Number of children: N/A  . Years of education: N/A   Social History Main Topics  . Smoking status: Passive Smoke Exposure -  Never Smoker  . Smokeless tobacco: Never Used     Comment: Parents smoked when he was a child.   . Alcohol use Yes     Comment: occ  . Drug use: No  . Sexual activity: Not Asked   Other Topics Concern  . None   Social History Narrative   [facility] (01/16/17):   Originally from [place], [place]. Has always lived in [place]. 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 No asbestos or mold exposure. Currently has a dog. No bird or hot tub exposure. Does have carpet in the bedrooms. No indoor plants. Does have curtains. No down pillows or bedding. Remotely did have a down pillow.       Objective:   Physical Exam BP 138/90 (BP Location: Left Arm, Patient Position: Sitting, Cuff Size: Normal)   Pulse (!) 110   Ht 6' (1.829 m)   Wt 245 lb 3.2 oz (111.2 kg)   SpO2 96%   BMI 33.26 kg/m  General:  Awake. Alert. No acute distress. Mild central obesity. Integument:  Warm & dry. No rash on exposed skin. No bruising. Extremities:  No cyanosis or clubbing.  Lymphatics:  No appreciated cervical or supraclavicular lymphadenoapthy. HEENT:  Moist mucus membranes. No oral ulcers. No scleral injection or icterus. Mild bilateral nasal turbinate swelling right greater than left with erythematous mucosa. Cardiovascular:  Regular rhythm and mildly tachycardic. No edema. No appreciable JVD.  Pulmonary:  Good aeration & clear to auscultation bilaterally. Symmetric chest wall expansion. No accessory muscle use. Abdomen: Soft. Normal bowel sounds. Protuberant. Grossly nontender. Musculoskeletal:  Normal bulk and tone. Hand grip strength 5/5 bilaterally. No joint effusion appreciated. Mild synovial thickening of bilateral DIP and PIP joints. Neurological:  CN 2-12 grossly in tact. No meningismus. Moving all 4 extremities equally. Symmetric brachioradialis deep tendon reflexes. Psychiatric:  Mood and affect congruent. Speech normal rhythm, rate & tone.   IMAGING CXR PA/LAT 11/22/16 (personally reviewed by me): No parenchymal nodule, opacity, or mass appreciated. No pleural effusion. Somewhat low lung volumes. Heart normal in size & mediastinum normal in contour.    Assessment & Plan:  51 y.o. male with progressive dyspnea over some time. Etiology for his progressive shortness of breath is unclear but could represent evolving  asthma. Certainly physical deconditioning could be the cause given his lack of exercise with his multiple joint problems. Reviewing his chest x-ray shows no parenchymal cause but with his underlying arthritis an alternative etiology could certainly be possible. However on physical exam, it appears as though this is most consistent with osteoarthritis. I instructed to the patient contact my office if he had any new breathing problems or questions before his next appointment.  1. Shortness of breath: Checking for pulmonary function testing as well as 6 minute walk test on room air before next appointment. Further testing depending upon this result. 2. GERD: Currently on Nexium. Suboptimally controlled. No changes. 3. Arthritis: Appears most consistent with osteoarthritis. 4. Follow-up: Patient to return to clinic in 4 weeks or sooner if needed.  Sonia Baller Ashok Cordia, M.D. [facility] Pulmonary & Critical Care Pager:  [phone] After 3pm or if no response, call [phone] 3:45 PM 01/16/17

## 2017-01-16 NOTE — Patient Instructions (Signed)
   Call or e-mail me if your cough returns or you have any other thoughts/questions.  I will see you back to review your breathing & walking test at your next appointment.  TESTS ORDERED: 1. Full PFTs before next appointment 2. 6MWT on room air before next appointment

## 2017-01-30 ENCOUNTER — Encounter (HOSPITAL_COMMUNITY): Payer: Medicare Other

## 2017-02-13 ENCOUNTER — Other Ambulatory Visit (INDEPENDENT_AMBULATORY_CARE_PROVIDER_SITE_OTHER): Payer: Medicare Other

## 2017-02-13 ENCOUNTER — Ambulatory Visit (INDEPENDENT_AMBULATORY_CARE_PROVIDER_SITE_OTHER): Payer: Medicare Other | Admitting: Pulmonary Disease

## 2017-02-13 ENCOUNTER — Encounter: Payer: Self-pay | Admitting: Pulmonary Disease

## 2017-02-13 VITALS — BP 150/110 | HR 114 | Ht 72.0 in | Wt 247.8 lb

## 2017-02-13 DIAGNOSIS — R0600 Dyspnea, unspecified: Secondary | ICD-10-CM

## 2017-02-13 DIAGNOSIS — K219 Gastro-esophageal reflux disease without esophagitis: Secondary | ICD-10-CM

## 2017-02-13 LAB — CBC WITH DIFFERENTIAL/PLATELET
Basophils Absolute: 0 10*3/uL (ref 0.0–0.1)
Basophils Relative: 0.8 % (ref 0.0–3.0)
EOS PCT: 2.1 % (ref 0.0–5.0)
Eosinophils Absolute: 0.1 10*3/uL (ref 0.0–0.7)
HCT: 46.9 % (ref 39.0–52.0)
Hemoglobin: 16.3 g/dL (ref 13.0–17.0)
Lymphocytes Relative: 43 % (ref 12.0–46.0)
Lymphs Abs: 2.8 10*3/uL (ref 0.7–4.0)
MCHC: 34.6 g/dL (ref 30.0–36.0)
MCV: 90.9 fl (ref 78.0–100.0)
MONOS PCT: 9 % (ref 3.0–12.0)
Monocytes Absolute: 0.6 10*3/uL (ref 0.1–1.0)
NEUTROS ABS: 3 10*3/uL (ref 1.4–7.7)
Neutrophils Relative %: 45.1 % (ref 43.0–77.0)
PLATELETS: 211 10*3/uL (ref 150.0–400.0)
RBC: 5.16 Mil/uL (ref 4.22–5.81)
RDW: 13.9 % (ref 11.5–15.5)
WBC: 6.6 10*3/uL (ref 4.0–10.5)

## 2017-02-13 MED ORDER — MONTELUKAST SODIUM 10 MG PO TABS: 10.0000 mg | ORAL_TABLET | Freq: Every day | ORAL | 3 refills | Status: DC

## 2017-02-13 NOTE — Progress Notes (Signed)
Test reviewed.  

## 2017-02-13 NOTE — Progress Notes (Signed)
Subjective:    Patient ID: [name] Jenkins, male    DOB: 1966-11-20, 51 y.o.   MRN: [id number]  C.C.:  Follow-up for Dyspnea & GERD.  HPI Dyspnea: Seemingly progressive with time. Patient no showed for his pulmonary function testing on 3/8. Patient reported confusion this morning and did not arrive for his 6 minute walk test at 9 AM. He does feel his dyspnea is worsening. He reports he has trouble sleeping at night because of sinus congestion. He does feel like his dyspnea is getting worse. Denies any associated coughing or wheezing.   GERD:  No reflux or dyspepsia. No morning brash water taste. He is taking OTC Nexium.   Review of Systems He denies any post-nasal drainage. He does have sinus congestion intermittently. Does have intermittent chest pain that is "sharp" and intermittent. No fever or chills. No chest tightness or pressure otherwise.   No Known Allergies  Current Outpatient Prescriptions on File Prior to Visit  Medication Sig Dispense Refill  . alprazolam (XANAX) 2 MG tablet Take 2 tablets by mouth 2 (two) times daily.    . divalproex (DEPAKOTE ER) 500 MG 24 hr tablet Take by mouth daily. 100 mg tablets, takes four daily    . FLUoxetine (PROZAC) 40 MG capsule Take 2 capsules by mouth daily.    Marland Kitchen lisdexamfetamine (VYVANSE) 70 MG capsule Take 1 capsule by mouth daily.    . meloxicam (MOBIC) 15 MG tablet Take 1 tablet by mouth at bedtime.    . Oxycodone HCl 10 MG TABS Take 1 tablet by mouth every 4 (four) hours as needed.    . OXYCONTIN 20 MG 12 hr tablet Take 1 tablet by mouth 2 times daily at 12 noon and 4 pm.    . QUEtiapine (SEROQUEL) 200 MG tablet Take by mouth. 200mg  in morning and 500 before bed    . zolpidem (AMBIEN) 10 MG tablet Take 1 tablet by mouth daily.    Marland Kitchen HYDROcodone-homatropine (HYCODAN) 5-1.5 MG/5ML syrup Take 5 mLs by mouth every 4 (four) hours as needed for cough. (Patient not taking: Reported on 02/13/2017) 120 mL 0   No current facility-administered  medications on file prior to visit.     Past Medical History:  Diagnosis Date  . Arthritis   . Depression   . GERD (gastroesophageal reflux disease)     Past Surgical History:  Procedure Laterality Date  . none      Family History  Problem Relation Age of Onset  . Lung disease Mother   . Lung cancer Father   . Emphysema Maternal Grandfather   . Heart disease Maternal Grandfather     Social History   Social History  . Marital status: Married    Spouse name: N/A  . Number of children: N/A  . Years of education: N/A   Social History Main Topics  . Smoking status: Passive Smoke Exposure - Never Smoker  . Smokeless tobacco: Never Used     Comment: Parents smoked when he was a child.   . Alcohol use Yes     Comment: occ  . Drug use: No  . Sexual activity: Not Asked   Other Topics Concern  . None   Social History Narrative   [facility] (01/16/17):   Originally from [place], [place]. Has always lived in [place]. 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 No asbestos or mold exposure. Currently  has a dog. No bird or hot tub exposure. Does have carpet in the bedrooms. No indoor plants. Does have curtains. No down pillows or bedding. Remotely did have a down pillow.       Objective:   Physical Exam BP (!) 150/110 (BP Location: Right Arm, Patient Position: Sitting, Cuff Size: Normal)   Pulse (!) 114   Ht 6' (1.829 m)   Wt 247 lb 12.8 oz (112.4 kg)   SpO2 97%   BMI 33.61 kg/m   Gen.: No distress. Mild central obesity. Comfortable. Integument: No rashes or bruising on exposed skin. Warm and dry. HEENT: Mild bilateral nasal turbinate swelling right greater than left. Erythematous mucosa in the sinus cavities. Moist mucous membranes. Pulmonary: Overall clear with auscultation. No accessory muscle use on room air. Cardiovascular: Regular rate and rhythm. No edema. Normal  S1 & S2. Abdomen: Soft. Nondistended. Normal bowel sounds.  6MWT 02/13/17:  Walked 460 meters / Baseline Sat 98% on RA / Nadir Sat 98% on RA (patient c/o dizziness with exertion)  IMAGING CXR PA/LAT 11/22/16 (previously reviewed by me): No parenchymal nodule, opacity, or mass appreciated. No pleural effusion. Somewhat low lung volumes. Heart normal in size & mediastinum normal in contour.    Assessment & Plan:  51 y.o. male with progressive dyspnea and GERD.  I reviewed the patient's 6 minute walk test today with him which shows no significant desaturation or evidence of hypoxia. The patient's blood pressure is also stable with exercise as well as his heart rate. I still question whether or not he may have underlying asthma given what appears to be allergic rhinitis on physical exam. Overall his reflux is well controlled. I am going to start the patient on Singulair to help not only his allergies but also is possible underlying asthma. I'm holding off on starting inhaled medication therapies pending further pulmonary function testing. I instructed the patient contact my office if he had any new breathing problems or questions before his next appointment.  1. Dyspnea:  Suspect underlying asthma. Starting patient on Singulair 10 mg by mouth daily at bedtime. We are scheduling patient's full pulmonary function testing. If this is unrevealing consider methacholine challenge testing versus cardiopulmonary exercise testing. 2. Allergic rhinitis: Checking CBC with differential & RAST panel. Starting Singulair 10 mg by mouth daily at bedtime. 3. GERD: Well-controlled with Nexium. No changes. 4. Follow-up: Return to clinic in 6 weeks or sooner if needed.  Sonia Baller Ashok Cordia, M.D. [facility] Pulmonary & Critical Care Pager:  [phone] After 3pm or if no response, call [phone] 10:31 AM 02/13/17

## 2017-02-13 NOTE — Patient Instructions (Addendum)
   I'm starting you on Singulair to help with any sinus allergies you have as well as with your possible underlying asthma.  We will need to re-schedule your breathing tests to evaluate your lung function. These will be done before I see you back in office.  I will see you back in 6 weeks but call me if you feel the medication isn't helping or you feel you are getting worse.  TESTS ORDERED: 1. CBC w/ differential & RAST Panel today 2. Reschedule Full PFTs to be done prior to next appointment

## 2017-02-14 LAB — RESPIRATORY ALLERGY PROFILE REGION II ~~LOC~~
ALLERGEN, COTTONWOOD, T14: 0.25 kU/L — AB
Allergen, A. alternata, m6: <0.1 kU/L
Allergen, C. Herbarum, M2: <0.1 kU/L
Allergen, D pternoyssinus,d7: <0.1 kU/L
Allergen, Mulberry, t76: <0.1 kU/L
Box Elder IgE: <0.1 kU/L
D. farinae: 0.22 kU/L — ABNORMAL HIGH
Dog Dander: <0.1 kU/L
Elm IgE: <0.1 kU/L
IGE (IMMUNOGLOBULIN E), SERUM: 650 kU/L — AB (ref <115)
Pecan/Hickory Tree IgE: <0.1 kU/L
Rough Pigweed  IgE: <0.1 kU/L
Sheep Sorrel IgE: <0.1 kU/L
TIMOTHY GRASS: 0.16 kU/L — AB

## 2017-04-09 ENCOUNTER — Encounter: Payer: Self-pay | Admitting: Pulmonary Disease

## 2017-04-09 ENCOUNTER — Other Ambulatory Visit (INDEPENDENT_AMBULATORY_CARE_PROVIDER_SITE_OTHER): Payer: Medicare Other

## 2017-04-09 ENCOUNTER — Ambulatory Visit (INDEPENDENT_AMBULATORY_CARE_PROVIDER_SITE_OTHER): Payer: Medicare Other | Admitting: Pulmonary Disease

## 2017-04-09 ENCOUNTER — Encounter: Payer: Self-pay | Admitting: Gastroenterology

## 2017-04-09 VITALS — BP 120/90 | HR 98 | Ht 72.0 in | Wt 249.0 lb

## 2017-04-09 DIAGNOSIS — J309 Allergic rhinitis, unspecified: Secondary | ICD-10-CM

## 2017-04-09 DIAGNOSIS — R0600 Dyspnea, unspecified: Secondary | ICD-10-CM

## 2017-04-09 DIAGNOSIS — K921 Melena: Secondary | ICD-10-CM | POA: Insufficient documentation

## 2017-04-09 DIAGNOSIS — K219 Gastro-esophageal reflux disease without esophagitis: Secondary | ICD-10-CM | POA: Diagnosis not present

## 2017-04-09 DIAGNOSIS — J984 Other disorders of lung: Secondary | ICD-10-CM | POA: Diagnosis not present

## 2017-04-09 LAB — PULMONARY FUNCTION TEST
DL/VA % PRED: 92 %
DL/VA: 4.41 ml/min/mmHg/L
DLCO COR % PRED: 79 %
DLCO COR: 27.75 ml/min/mmHg
DLCO UNC % PRED: 84 %
DLCO unc: 29.46 ml/min/mmHg
FEF 25-75 POST: 3.47 L/s
FEF 25-75 PRE: 2.69 L/s
FEF2575-%CHANGE-POST: 28 %
FEF2575-%PRED-POST: 96 %
FEF2575-%PRED-PRE: 74 %
FEV1-%Change-Post: 10 %
FEV1-%PRED-PRE: 78 %
FEV1-%Pred-Post: 86 %
FEV1-Post: 3.58 L
FEV1-Pre: 3.23 L
FEV1FVC-%CHANGE-POST: 4 %
FEV1FVC-%Pred-Pre: 99 %
FEV6-%CHANGE-POST: 6 %
FEV6-%Pred-Post: 86 %
FEV6-%Pred-Pre: 81 %
FEV6-Post: 4.49 L
FEV6-Pre: 4.21 L
FEV6FVC-%Change-Post: 0 %
FEV6FVC-%Pred-Post: 103 %
FEV6FVC-%Pred-Pre: 104 %
FVC-%Change-Post: 6 %
FVC-%PRED-PRE: 78 %
FVC-%Pred-Post: 83 %
FVC-PRE: 4.22 L
FVC-Post: 4.49 L
POST FEV1/FVC RATIO: 80 %
PRE FEV1/FVC RATIO: 77 %
Post FEV6/FVC ratio: 100 %
Pre FEV6/FVC Ratio: 100 %
RV % pred: 102 %
RV: 2.24 L
TLC % pred: 78 %
TLC: 5.75 L

## 2017-04-09 LAB — PROTIME-INR
INR: 1.1 ratio — ABNORMAL HIGH (ref 0.8–1.0)
PROTHROMBIN TIME: 11.6 s (ref 9.6–13.1)

## 2017-04-09 LAB — CBC WITH DIFFERENTIAL/PLATELET
BASOS PCT: 1 % (ref 0.0–3.0)
Basophils Absolute: 0.1 10*3/uL (ref 0.0–0.1)
Eosinophils Absolute: 0.1 10*3/uL (ref 0.0–0.7)
Eosinophils Relative: 1.5 % (ref 0.0–5.0)
HEMATOCRIT: 46.4 % (ref 39.0–52.0)
HEMOGLOBIN: 15.9 g/dL (ref 13.0–17.0)
LYMPHS PCT: 43.8 % (ref 12.0–46.0)
Lymphs Abs: 3.2 10*3/uL (ref 0.7–4.0)
MCHC: 34.3 g/dL (ref 30.0–36.0)
MCV: 90.4 fl (ref 78.0–100.0)
MONOS PCT: 10.6 % (ref 3.0–12.0)
Monocytes Absolute: 0.8 10*3/uL (ref 0.1–1.0)
Neutro Abs: 3.1 10*3/uL (ref 1.4–7.7)
Neutrophils Relative %: 43.1 % (ref 43.0–77.0)
Platelets: 196 10*3/uL (ref 150.0–400.0)
RBC: 5.14 Mil/uL (ref 4.22–5.81)
RDW: 13.4 % (ref 11.5–15.5)
WBC: 7.2 10*3/uL (ref 4.0–10.5)

## 2017-04-09 LAB — APTT: APTT: 31.3 s (ref 23.4–32.7)

## 2017-04-09 NOTE — Patient Instructions (Addendum)
   Seek immediate medical attention if you have any increase in the bleeding with your bowel movement.  I'm referring you to a GI specialist to get a colonoscopy.  We will review your breathing tests when I see you back.  TESTS ORDERED: 1. HRCT Chest w/o 2. Methacholine Challenge Test 3. Overnight Oximetry on room air 4. Serum CBC, INR, & PTT.

## 2017-04-09 NOTE — Progress Notes (Signed)
PFT done today. 

## 2017-04-09 NOTE — Progress Notes (Signed)
Subjective:    Patient ID: [name] Jenkins, male    DOB: 1966-09-14, 51 y.o.   MRN: [id number]  C.C.:  Follow-up for Dyspnea, Chronic Allergic Rhinitis, & GERD.  HPI Dyspnea: Suspect underlying asthma. Started on Singulair 10 mg by mouth daily at bedtime last appointment.Spirometry today continues to show no evidence of airway obstruction by his lung volumes are abnormal with very mild restriction. He reports he feels his breathing is progressively worsening. He reports his breathing seems to be worse at night. Denies any coughing but does clear his throat occasionally. Minimal wheezing.   Chronic allergic rhinitis: Patient started on Singulair last appointment. He reports he continues to have sinus congestion & pressure with drainage. He isn't sure if the Singulair has helped significantly.   GERD: Previously taking over-the-counter Nexium. He reports no reflux or dyspepsia.   Review of Systems  He reports he has intermittent hand swelling & numbness. Denies any other joint swelling or erythema. He reports chronic pain in his left shoulder and right elbow. He reports no chest pain or tightness. No rashes or abnormal bruising. Patient is reporting some constipation. Reports he does have some minimal hematochezia but denies any blood with wiping after having a bowel movement or blood mixed in with or on top of his bowel movements. Patient has never had a colonoscopy.   No Known Allergies  Current Outpatient Prescriptions on File Prior to Visit  Medication Sig Dispense Refill  . alprazolam (XANAX) 2 MG tablet Take 2 tablets by mouth 2 (two) times daily.    . divalproex (DEPAKOTE ER) 500 MG 24 hr tablet Take by mouth daily. 100 mg tablets, takes four daily    . FLUoxetine (PROZAC) 40 MG capsule Take 2 capsules by mouth daily.    Marland Kitchen lisdexamfetamine (VYVANSE) 70 MG capsule Take 1 capsule by mouth daily.    . meloxicam (MOBIC) 15 MG tablet Take 1 tablet by mouth at bedtime.    . montelukast  (SINGULAIR) 10 MG tablet Take 1 tablet (10 mg total) by mouth at bedtime. 30 tablet 3  . Oxycodone HCl 10 MG TABS Take 1 tablet by mouth every 4 (four) hours as needed.    . OXYCONTIN 20 MG 12 hr tablet Take 1 tablet by mouth 2 times daily at 12 noon and 4 pm.    . QUEtiapine (SEROQUEL) 200 MG tablet Take by mouth. 200mg  in morning and 500 before bed    . zolpidem (AMBIEN) 10 MG tablet Take 1 tablet by mouth daily.    Marland Kitchen HYDROcodone-homatropine (HYCODAN) 5-1.5 MG/5ML syrup Take 5 mLs by mouth every 4 (four) hours as needed for cough. (Patient not taking: Reported on 04/09/2017) 120 mL 0   No current facility-administered medications on file prior to visit.     Past Medical History:  Diagnosis Date  . Arthritis   . Depression   . GERD (gastroesophageal reflux disease)     Past Surgical History:  Procedure Laterality Date  . none      Family History  Problem Relation Age of Onset  . Lung disease Mother   . Lung cancer Father   . Emphysema Maternal Grandfather   . Heart disease Maternal Grandfather     Social History   Social History  . Marital status: Married    Spouse name: N/A  . Number of children: N/A  . Years of education: N/A   Social History Main Topics  . Smoking status: Passive Smoke Exposure - Never Smoker  .  Smokeless tobacco: Never Used     Comment: Parents smoked when he was a child.   . Alcohol use Yes     Comment: occ  . Drug use: No  . Sexual activity: Not Asked   Other Topics Concern  . None   Social History Narrative   [facility] (01/16/17):   Originally from [place], [place]. Has always lived in [place]. 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 No asbestos or mold exposure. Currently has a dog. No bird or hot tub exposure. Does have carpet in the bedrooms. No indoor plants. Does have curtains. No down pillows or bedding. Remotely did have a down  pillow.       Objective:   Physical Exam BP 120/90 (BP Location: Right Arm, Patient Position: Sitting, Cuff Size: Large)   Pulse 98   Ht 6' (1.829 m)   Wt 249 lb (112.9 kg)   SpO2 97%   BMI 33.77 kg/m   Gen.: Mild central obesity. No distress. Comfortable. Integument: No rash. No bruising. Warm. HEENT: Moderate bilateral nasal turbinate swelling. Moist mucous membranes. No oral ulcers. Pulmonary: Clear to auscultation. Normal work of breathing on room air. Cardiovascular: Regular rate. Regular rhythm. No edema. Abdomen: Soft. Protuberant. Normal bowel sounds.  PFT 04/09/17: FVC 4.22 L (78%) FEV1 3.23 L (78%) FEV1/FVC 0.77 FEF 25-65 2.69 L (74%) negative bronchodilator response TLC 5.75 L (78%) RV 102% ERV 97% DLCO corrected 79%  6MWT 02/13/17:  Walked 460 meters / Baseline Sat 98% on RA / Nadir Sat 98% on RA (patient c/o dizziness with exertion)  IMAGING CXR PA/LAT 11/22/16 (previously reviewed by me): No parenchymal nodule, opacity, or mass appreciated. No pleural effusion. Somewhat low lung volumes. Heart normal in size & mediastinum normal in contour.  LABS 02/13/17 CBC: 6.6/16.3/46.9/211 Eosinophils: 0.1 IgE: 650 RAST panel:  [place] 0.16 / D farinae 0.22    Assessment & Plan:  51 y.o. male with dyspnea, chronic allergic rhinitis, & GERD. Patient also reports hematochezia today. He has never had a colonoscopy and given the fact that he is 38 likely needs evaluation by GI and at least a screening colonoscopy. Patient's dyspnea continues which could be secondary to deconditioning or possibly still underlying asthma. His pulmonary function testing was reviewed with him today which shows mild restriction which could be secondary to his central obesity versus an underlying parenchymal process. He is questioning whether or not nocturnal oxygen therapy would be of benefit. I instructed the patient contact my office if he had any questions and instructed him to seek any medical  attention if he had any worsening in his rectal bleeding.   1. Dyspnea: Checking serum CBC today. Checking methacholine challenge test. Checking overnight oximetry on room air. 2. Chronic allergic rhinitis:  Continuing singular. No changes.  3. GERD: Continuing over-the-counter Nexium. Good control.  4. Mild restrictive lung disease: Checking high-resolution CT chest without contrast.   5. Hematochezia: Checking serum CBC and coags. Referring to GI for evaluation and colonoscopy.  6. Follow-up: Return to clinic in  4 weeks or sooner if needed.  Sonia Baller Ashok Cordia, M.D. [facility] Pulmonary & Critical Care Pager:  [phone] After 3pm or if no response, call [phone] 10:54 AM 04/09/17

## 2017-04-15 ENCOUNTER — Ambulatory Visit (INDEPENDENT_AMBULATORY_CARE_PROVIDER_SITE_OTHER): Payer: Medicare Other | Admitting: Gastroenterology

## 2017-04-15 ENCOUNTER — Encounter: Payer: Self-pay | Admitting: Gastroenterology

## 2017-04-15 ENCOUNTER — Ambulatory Visit (HOSPITAL_COMMUNITY)
Admission: RE | Admit: 2017-04-15 | Discharge: 2017-04-15 | Disposition: A | Discharge status: Home / Self Care | Payer: Medicare Other | Source: Ambulatory Visit | Attending: Pulmonary Disease | Admitting: Pulmonary Disease

## 2017-04-15 VITALS — BP 124/90 | HR 100 | Ht 72.0 in | Wt 249.8 lb

## 2017-04-15 DIAGNOSIS — J984 Other disorders of lung: Secondary | ICD-10-CM | POA: Diagnosis not present

## 2017-04-15 DIAGNOSIS — R0600 Dyspnea, unspecified: Secondary | ICD-10-CM | POA: Insufficient documentation

## 2017-04-15 DIAGNOSIS — K625 Hemorrhage of anus and rectum: Secondary | ICD-10-CM | POA: Diagnosis not present

## 2017-04-15 DIAGNOSIS — Z1211 Encounter for screening for malignant neoplasm of colon: Secondary | ICD-10-CM | POA: Insufficient documentation

## 2017-04-15 LAB — PULMONARY FUNCTION TEST
FEF 25-75 Pre: 3.36 L/sec
FEF2575-%Pred-Pre: 93 %
FEV1-%Change-Post: -41 %
FEV1-%PRED-POST: 47 %
FEV1-%PRED-PRE: 80 %
FEV1-PRE: 3.35 L
FEV1-Post: 1.95 L
FEV1FVC-%Change-Post: -14 %
FEV1FVC-%Pred-Pre: 106 %
FEV6-%Change-Post: -35 %
FEV6-%PRED-PRE: 78 %
FEV6-%Pred-Post: 50 %
FEV6-POST: 2.63 L
FEV6-PRE: 4.07 L
FEV6FVC-%Change-Post: -2 %
FEV6FVC-%PRED-POST: 101 %
FEV6FVC-%Pred-Pre: 104 %
FVC-%Change-Post: -31 %
FVC-%PRED-PRE: 76 %
FVC-%Pred-Post: 52 %
FVC-Post: 2.8 L
FVC-Pre: 4.09 L
POST FEV6/FVC RATIO: 97 %
PRE FEV1/FVC RATIO: 82 %
PRE FEV6/FVC RATIO: 100 %
Post FEV1/FVC ratio: 70 %

## 2017-04-15 MED ORDER — METHACHOLINE 16 MG/ML NEB SOLN
2.0000 mL | Freq: Once | RESPIRATORY_TRACT | Status: DC
  Filled 2017-04-15: qty 2

## 2017-04-15 MED ORDER — METHACHOLINE 0.25 MG/ML NEB SOLN
2.0000 mL | Freq: Once | RESPIRATORY_TRACT | Status: DC
  Filled 2017-04-15: qty 2

## 2017-04-15 MED ORDER — METHACHOLINE 0.0625 MG/ML NEB SOLN
2.0000 mL | Freq: Once | RESPIRATORY_TRACT | Status: AC
  Administered 2017-04-15: 0.125 mg via RESPIRATORY_TRACT
  Filled 2017-04-15: qty 2

## 2017-04-15 MED ORDER — ALBUTEROL SULFATE (2.5 MG/3ML) 0.083% IN NEBU
2.5000 mg | INHALATION_SOLUTION | Freq: Once | RESPIRATORY_TRACT | Status: AC
  Administered 2017-04-15: 2.5 mg via RESPIRATORY_TRACT

## 2017-04-15 MED ORDER — METHACHOLINE 1 MG/ML NEB SOLN
2.0000 mL | Freq: Once | RESPIRATORY_TRACT | Status: DC
  Filled 2017-04-15: qty 2

## 2017-04-15 MED ORDER — NA SULFATE-K SULFATE-MG SULF 17.5-3.13-1.6 GM/177ML PO SOLN: 1.0000 | Freq: Once | ORAL | 0 refills | Status: AC

## 2017-04-15 MED ORDER — METHACHOLINE 4 MG/ML NEB SOLN
2.0000 mL | Freq: Once | RESPIRATORY_TRACT | Status: DC
  Filled 2017-04-15: qty 2

## 2017-04-15 MED ORDER — HYDROCORTISONE ACETATE 25 MG RE SUPP: RECTAL | 2 refills | Status: DC

## 2017-04-15 MED ORDER — SODIUM CHLORIDE 0.9 % IN NEBU
3.0000 mL | INHALATION_SOLUTION | Freq: Once | RESPIRATORY_TRACT | Status: AC
  Administered 2017-04-15: 3 mL via RESPIRATORY_TRACT
  Filled 2017-04-15: qty 3

## 2017-04-15 NOTE — Progress Notes (Signed)
04/15/2017 [name] Jenkins [id number] 01-Nov-1966   HISTORY OF PRESENT ILLNESS:  This is a 51 year old male who is new to our practice.  Referred here by Dr. Ashok Cordia for evaluation regarding rectal bleeding.  He's never had colonoscopy in the past.  He says that he sees bright red blood on the toilet paper and then sometimes it drips into the toilet bowl.  Sometimes it is very small amounts but other times it is more.  Says that he knows that he has hemorrhoids that stick out and then he usually tries to push them back in.  This has been occurring for several months.  CBC normal.  Denies constipation for the most part, strains on occasion, but nothing significant.     Past Medical History:  Diagnosis Date  . Arthritis   . Depression   . GERD (gastroesophageal reflux disease)    Past Surgical History:  Procedure Laterality Date  . none      reports that he is a non-smoker but has been exposed to tobacco smoke. He has never used smokeless tobacco. He reports that he drinks alcohol. He reports that he does not use drugs. family history includes Emphysema in his maternal grandfather; Heart disease in his maternal grandfather; Lung cancer in his father; Lung disease in his mother. No Known Allergies    Outpatient Encounter Prescriptions as of 04/15/2017  Medication Sig  . alprazolam (XANAX) 2 MG tablet Take 2 tablets by mouth 2 (two) times daily.  . divalproex (DEPAKOTE ER) 500 MG 24 hr tablet Take by mouth daily. 100 mg tablets, takes four daily  . FLUoxetine (PROZAC) 40 MG capsule Take 2 capsules by mouth daily.  Marland Kitchen lisdexamfetamine (VYVANSE) 70 MG capsule Take 1 capsule by mouth daily.  . meloxicam (MOBIC) 15 MG tablet Take 1 tablet by mouth at bedtime.  . montelukast (SINGULAIR) 10 MG tablet Take 1 tablet (10 mg total) by mouth at bedtime.  . Oxycodone HCl 10 MG TABS Take 1 tablet by mouth every 4 (four) hours as needed.  . OXYCONTIN 20 MG 12 hr tablet Take 1 tablet by mouth 2 times  daily at 12 noon and 4 pm.  . QUEtiapine (SEROQUEL) 200 MG tablet Take by mouth. 200mg  in morning and 500 before bed  . zolpidem (AMBIEN) 10 MG tablet Take 1 tablet by mouth daily.  . [DISCONTINUED] HYDROcodone-homatropine (HYCODAN) 5-1.5 MG/5ML syrup Take 5 mLs by mouth every 4 (four) hours as needed for cough. (Patient not taking: Reported on 04/15/2017)   No facility-administered encounter medications on file as of 04/15/2017.      REVIEW OF SYSTEMS  : All other systems reviewed and negative except where noted in the History of Present Illness.   PHYSICAL EXAM: BP 124/90   Pulse 100   Ht 6' (1.829 m)   Wt 249 lb 12.8 oz (113.3 kg)   BMI 33.88 kg/m  General: Well developed white male in no acute distress Head: Normocephalic and atraumatic Eyes:  Sclerae anicteric, conjunctiva pink. Ears: Normal auditory acuity Lungs: Clear throughout to auscultation Heart: Regular rate and rhythm Abdomen: Soft, non-distended. Normal bowel sounds.  Mild lower abdominal TTP. Rectal:  No significant external abnormalities noted.  DRE did not reveal any masses.  No stool on exam glove. Musculoskeletal: Symmetrical with no gross deformities  Skin: No lesions on visible extremities Extremities: No edema  Neurological: Alert oriented x 4, grossly non-focal Cervical Nodes:  No significant cervical adenopathy Psychological:  Alert and cooperative.  Normal mood and affect  ASSESSMENT AND PLAN: -Screening colonoscopy:  Never had one in the past.  Will schedule with Dr. Ardis Hughs.  The risks, benefits, and alternatives to colonoscopy were discussed with the patient and he consents to proceed. -Rectal bleeding:  I suspect that he has quite large internal hemorrhoids that are causing his symptoms.  I am going to give him hydrocortisone suppositories to use at bedtime to see if they help for now.  After colonoscopy then could consider in-office hemorrhoid banding if appropriate.  CC:  Chesley Noon, MD  CC:   Dr. Ashok Cordia

## 2017-04-15 NOTE — Progress Notes (Signed)
I agree with the above note, plan 

## 2017-04-15 NOTE — Patient Instructions (Addendum)
We have sent the following medications to your pharmacy for you to pick up at your convenience: 1. Hydrocortisone suppositories 2. Suprep for the colonoscopy  You have been scheduled for a colonoscopy. Please follow written instructions given to you at your visit today.  Please pick up your prep supplies at the pharmacy within the next 1-3 days. [facility] [address] If you use inhalers (even only as needed), please bring them with you on the day of your procedure. Your physician has requested that you go to www.startemmi.com and enter the access code given to you at your visit today. This web site gives a general overview about your procedure. However, you should still follow specific instructions given to you by our office regarding your preparation for the procedure.

## 2017-04-22 ENCOUNTER — Ambulatory Visit (INDEPENDENT_AMBULATORY_CARE_PROVIDER_SITE_OTHER)
Admission: RE | Admit: 2017-04-22 | Discharge: 2017-04-22 | Disposition: A | Discharge status: Home / Self Care | Payer: Medicare Other | Source: Ambulatory Visit | Attending: Pulmonary Disease | Admitting: Pulmonary Disease

## 2017-04-22 DIAGNOSIS — R0600 Dyspnea, unspecified: Secondary | ICD-10-CM | POA: Diagnosis not present

## 2017-04-25 NOTE — Progress Notes (Signed)
Left message to contact office

## 2017-04-28 ENCOUNTER — Telehealth: Payer: Self-pay | Admitting: Pulmonary Disease

## 2017-04-28 NOTE — Telephone Encounter (Signed)
Left message for patient to call back. Assuming patient is asking for the following results:   Javier Glazier, MD  Tyson Dense, RN        Please let the patient know that his high-resolution CT scan showed no parenchymal lung disease. Thank you.

## 2017-04-29 NOTE — Telephone Encounter (Signed)
Left message for patient to call back x2.  

## 2017-04-30 NOTE — Telephone Encounter (Signed)
lmomtcb x 3 for the pt.   

## 2017-05-01 ENCOUNTER — Telehealth: Payer: Self-pay | Admitting: Pulmonary Disease

## 2017-05-01 NOTE — Telephone Encounter (Signed)
lmomtcb x 4 for the pt.  Will sign off of this message per protocol.

## 2017-05-01 NOTE — Telephone Encounter (Signed)
Notes recorded by Javier Glazier, MD on 04/24/2017 at 4:16 PM EDT Please let the patient know that his high-resolution CT scan showed no parenchymal lung disease. Thank you. ------------------------------------ lmtcb x1 for pt.

## 2017-05-02 NOTE — Telephone Encounter (Signed)
lmomtcb x 2 for the pt.  

## 2017-05-04 ENCOUNTER — Encounter: Payer: Self-pay | Admitting: Pulmonary Disease

## 2017-05-05 NOTE — Telephone Encounter (Signed)
Called and spoke with pt and he is aware of ct results.    Pt is requesting the results of his labs that were done.  JN please advise. Thanks  Pt stated that we can leave a VM on his phone about the lab results.

## 2017-05-05 NOTE — Telephone Encounter (Signed)
Let the patient know that his blood work was completely normal. Thanks.

## 2017-05-05 NOTE — Telephone Encounter (Signed)
Spoke with pt. He is aware of results. Nothing further was needed. 

## 2017-05-20 ENCOUNTER — Ambulatory Visit (INDEPENDENT_AMBULATORY_CARE_PROVIDER_SITE_OTHER): Payer: Medicare Other | Admitting: Pulmonary Disease

## 2017-05-20 ENCOUNTER — Encounter: Payer: Self-pay | Admitting: Pulmonary Disease

## 2017-05-20 VITALS — BP 140/90 | HR 91 | Ht 72.0 in | Wt 251.8 lb

## 2017-05-20 DIAGNOSIS — J309 Allergic rhinitis, unspecified: Secondary | ICD-10-CM

## 2017-05-20 DIAGNOSIS — G4734 Idiopathic sleep related nonobstructive alveolar hypoventilation: Secondary | ICD-10-CM

## 2017-05-20 DIAGNOSIS — R0609 Other forms of dyspnea: Secondary | ICD-10-CM

## 2017-05-20 DIAGNOSIS — K219 Gastro-esophageal reflux disease without esophagitis: Secondary | ICD-10-CM

## 2017-05-20 NOTE — Patient Instructions (Addendum)
   Continue using your medications as prescribed.  Try taking Zantac 150mg  or Pepcid 20mg  at night before bed to help with your reflux.  Call me if you feel your breathing is getting worse.   TESTS ORDERED: 1. Split-night sleep study

## 2017-05-20 NOTE — Progress Notes (Signed)
Subjective:    Patient ID: [name] Jenkins, male    DOB: [birth year], 51 y.o.   MRN: [id number]  C.C.:  Follow-up for Dyspnea, Chronic Allergic Rhinitis, GERD, & Mild Restrictive Lung Disease.  HPI Dyspnea: Previously on Singulair. Suspected underlying asthma. Questionable effort during methacholine challenge test. He still has dyspnea on minimal exertion. Previously tried inhalers such as Advair & "powders" that seemed to make his breathing worse and didn't seem to help. No wheezing. No coughing.   Chronic allergic rhinitis: Previously on Singulair. Last appointment patient was unsure of significant improvement. He reports minimal sinus congestion & pressure.   GERD: Previously taking over-the-counter Nexium. Referred to GI for intermittent hematochezia. He reports he quit taking Nexium due to concerns with the medication. He has been noticing more reflux.   Mild restrictive lung disease: No evidence of interstitial lung disease on high-resolution CT scan. Likely due to patient's central obesity.  Review of Systems  He is still waking up at night short of breath. No fever or chills. No chest pain or pressure. No rashes or bruising.   No Known Allergies  Current Outpatient Prescriptions on File Prior to Visit  Medication Sig Dispense Refill  . alprazolam (XANAX) 2 MG tablet Take 2 tablets by mouth 2 (two) times daily.    . divalproex (DEPAKOTE ER) 500 MG 24 hr tablet Take by mouth daily. 100 mg tablets, takes four daily    . FLUoxetine (PROZAC) 40 MG capsule Take 2 capsules by mouth daily.    Marland Kitchen lisdexamfetamine (VYVANSE) 70 MG capsule Take 1 capsule by mouth daily.    . meloxicam (MOBIC) 15 MG tablet Take 1 tablet by mouth at bedtime.    . montelukast (SINGULAIR) 10 MG tablet Take 1 tablet (10 mg total) by mouth at bedtime. 30 tablet 3  . Oxycodone HCl 10 MG TABS Take 1 tablet by mouth every 4 (four) hours as needed.    . OXYCONTIN 20 MG 12 hr tablet Take 1 tablet by mouth 2 times daily at  12 noon and 4 pm.    . QUEtiapine (SEROQUEL) 200 MG tablet Take by mouth. 200mg  in morning and 500 before bed    . zolpidem (AMBIEN) 10 MG tablet Take 1 tablet by mouth daily.     No current facility-administered medications on file prior to visit.     Past Medical History:  Diagnosis Date  . Arthritis   . Depression   . GERD (gastroesophageal reflux disease)     Past Surgical History:  Procedure Laterality Date  . none      Family History  Problem Relation Age of Onset  . Lung disease Mother   . Lung cancer Father   . Emphysema Maternal Grandfather   . Heart disease Maternal Grandfather     Social History   Social History  . Marital status: Married    Spouse name: N/A  . Number of children: 5  . Years of education: N/A   Social History Main Topics  . Smoking status: Never Smoker  . Smokeless tobacco: Never Used  . Alcohol use Yes     Comment: occ  . Drug use: No  . Sexual activity: Not Asked   Other Topics Concern  . None   Social History Narrative   [facility] (01/16/17):   Originally from [place], [place]. Has always lived in [place]. 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 No asbestos or mold exposure. Currently has a dog. No bird or hot tub exposure. Does have carpet in the bedrooms. No indoor plants. Does have curtains. No down pillows or bedding. Remotely did have a down pillow.       Objective:   Physical Exam BP 140/90 (BP Location: Left Arm, Cuff Size: Normal)   Pulse 91   Ht 6' (1.829 m)   Wt 251 lb 12.8 oz (114.2 kg)   SpO2 97%   BMI 34.15 kg/m   General:  Awake. Alert. No acute distress. Central obesity. Integument:  Warm & dry. No rash on exposed skin. No bruising on exposed skin:. Extremities:  No cyanosis or clubbing.  HEENT:  Moist mucus membranes. No oral ulcers. No scleral injection or icterus. Mild bilateral nasal turbinate  swelling. Cardiovascular:  Regular rate. No edema. Normal S1 & S2.  Pulmonary:  Good aeration & clear to auscultation bilaterally. Symmetric chest wall expansion. No accessory muscle use on room air. Abdomen: Soft. Normal bowel sounds. Protuberant. Musculoskeletal:  Normal bulk and tone. No joint deformity or effusion appreciated.  PFT 04/09/17: FVC 4.22 L (78%) FEV1 3.23 L (78%) FEV1/FVC 0.77 FEF 25-65 2.69 L (74%) negative bronchodilator response TLC 5.75 L (78%) RV 102% ERV 97% DLCO corrected 79%  6MWT 02/13/17:  Walked 460 meters / Baseline Sat 98% on RA / Nadir Sat 98% on RA (patient c/o dizziness with exertion)  OVERNIGHT OXIMETRY 05/03/17:  Performed on room air. Lowest saturation 79%. 104.5 minutes with saturation </= 88%.   METHACHOLINE CHALLENGE TEST (04/15/17): Patient effort erratic. Normal spirometry. Patient did have 20% reduction in FEV1 at the level I dilution but results questionable due to marginal effort.  IMAGING HRCT CHEST W/O 04/22/17 (personally reviewed by me):  No intralobular septal thickening, bronchiectasis, or honeycombing changes. Minimal linear scarring in the lower lung zones. No parenchymal nodule or opacity appreciated. No pleural effusion. No pleural thickening. No pathologic mediastinal adenopathy. No pericardial effusion.  CXR PA/LAT 11/22/16 (previously reviewed by me): No parenchymal nodule, opacity, or mass appreciated. No pleural effusion. Somewhat low lung volumes. Heart normal in size & mediastinum normal in contour.  LABS 02/13/17 CBC: 6.6/16.3/46.9/211 Eosinophils: 0.1 IgE: 650 RAST panel:  [place] 0.16 / D farinae 0.22    Assessment & Plan:  51 y.o. male with dyspnea, chronic allergic rhinitis, & GERD. Patient's restrictive lung disease is likely due to his central obesity. No evidence of interstitial lung disease on high-resolution CT imaging. Questionable effort during methacholine challenge test despite significant reduction in FEV1 at the  level I dilution. Patient's overnight oximetry does show significant nocturnal hypoxia which could indicate obstructive sleep apnea. Especially in the setting of his poor sleep quality. This could certainly be contributing overall to his dyspnea on exertion. I instructed the patient contact my office if he had any new breathing problems before his next appointment.  1. Dyspnea: Likely multifactorial. Holding off on further testing or inhaler medications at this time. 2. Nocturnal hypoxia: Checking split night sleep study for probable underlying OSA. 3. Chronic allergic rhinitis: Reasonably controlled on Singulair. No new medications. 4. GERD: Recommended trying over-the-counter Zantac or Pepcid daily at bedtime. 5. Restrictive lung disease: No need for further testing. 6. Follow-up: Return to clinic in 3 months or sooner if needed.  Sonia Baller Ashok Cordia, M.D. [facility] Pager:  [phone] After 3pm or if no response, call [phone] 9:39 AM 05/20/17

## 2017-06-13 ENCOUNTER — Other Ambulatory Visit: Payer: Self-pay | Admitting: Pulmonary Disease

## 2017-06-24 ENCOUNTER — Encounter: Payer: Self-pay | Admitting: Gastroenterology

## 2017-06-24 ENCOUNTER — Ambulatory Visit (AMBULATORY_SURGERY_CENTER): Payer: Medicare Other | Admitting: Gastroenterology

## 2017-06-24 VITALS — BP 113/74 | HR 87 | Temp 97.7°F | Resp 19 | Ht 72.0 in | Wt 249.0 lb

## 2017-06-24 DIAGNOSIS — Z1211 Encounter for screening for malignant neoplasm of colon: Secondary | ICD-10-CM | POA: Diagnosis not present

## 2017-06-24 DIAGNOSIS — D122 Benign neoplasm of ascending colon: Secondary | ICD-10-CM

## 2017-06-24 DIAGNOSIS — K649 Unspecified hemorrhoids: Secondary | ICD-10-CM

## 2017-06-24 DIAGNOSIS — K635 Polyp of colon: Secondary | ICD-10-CM | POA: Diagnosis not present

## 2017-06-24 DIAGNOSIS — K625 Hemorrhage of anus and rectum: Secondary | ICD-10-CM | POA: Diagnosis not present

## 2017-06-24 MED ORDER — SODIUM CHLORIDE 0.9 % IV SOLN: 500.0000 mL | INTRAVENOUS | Status: AC

## 2017-06-24 NOTE — Op Note (Signed)
[facility] Patient Name: [name] Jenkins Procedure Date: 06/24/2017 7:30 AM MRN: [id number] Endoscopist: Milus Banister , MD Age: 51 Referring MD:  Date of Birth: January 18, 1966 Gender: Male Account #: [id number] Procedure:                Colonoscopy Indications:              Rectal bleeding Medicines:                Monitored Anesthesia Care Procedure:                Pre-Anesthesia Assessment:                           - Prior to the procedure, a History and Physical                            was performed, and patient medications and                            allergies were reviewed. The patient's tolerance of                            previous anesthesia was also reviewed. The risks                            and benefits of the procedure and the sedation                            options and risks were discussed with the patient.                            All questions were answered, and informed consent                            was obtained. Prior Anticoagulants: The patient has                            taken no previous anticoagulant or antiplatelet                            agents. ASA Grade Assessment: II - A patient with                            mild systemic disease. After reviewing the risks                            and benefits, the patient was deemed in                            satisfactory condition to undergo the procedure.                           After obtaining informed consent, the colonoscope  was passed under direct vision. Throughout the                            procedure, the patient's blood pressure, pulse, and                            oxygen saturations were monitored continuously. The                            Colonoscope was introduced through the anus and                            advanced to the the cecum, identified by the                            ileocecal valve. The colonoscopy was performed                        without difficulty. The patient tolerated the                            procedure well. The quality of the bowel                            preparation was poor, this greatly limited the                            examination.The ileocecal valve and the rectum were                            photographed. I could not adequately visualize the                            appendiceal orifice due to the poor prep. Scope In: 8:01:22 AM Scope Out: 8:17:11 AM Scope Withdrawal Time: 0 hours 5 minutes 56 seconds  Total Procedure Duration: 0 hours 15 minutes 49 seconds  Findings:                 A 4 mm polyp was found in the ascending colon. The                            polyp was sessile. The polyp was removed with a                            cold snare. Resection was complete, the polyp was                            retrieved.                           Internal hemorrhoids were found. The hemorrhoids                            were small.  The exam was otherwise without abnormality on                            direct and retroflexion views. Complications:            No immediate complications. Estimated blood loss:                            None. Estimated Blood Loss:     Estimated blood loss: none. Impression:               - Preparation of the colon was poor.                           - One 4 mm polyp in the ascending colon, removed                            with a cold snare. Complete resection. Polyp was                            retrieved.                           - Internal hemorrhoids.                           - The examination was otherwise normal on direct                            and retroflexion views. Recommendation:           - Patient has a contact number available for                            emergencies. The signs and symptoms of potential                            delayed complications were discussed with the                             patient. Return to normal activities tomorrow.                            Written discharge instructions were provided to the                            patient.                           - Resume previous diet.                           - Continue present medications.                           - Repeat colonoscopy (with double prep protocol) at  the next available appointment because the bowel                            preparation was poor: there are no obstructing                            tumors but small, significant lesions could have                            been missed due to the poor prep.                           - Referral to [facility] office to consider                            in-office hemorrhoid banding procedure. Milus Banister, MD 06/24/2017 8:22:58 AM This report has been signed electronically.

## 2017-06-24 NOTE — Progress Notes (Signed)
Pt. Reports no change in his surgical or medical history since pre-visit.

## 2017-06-24 NOTE — Patient Instructions (Signed)
YOU HAD AN ENDOSCOPIC PROCEDURE TODAY AT [facility]:   Refer to the procedure report that was given to you for any specific questions about what was found during the examination.  If the procedure report does not answer your questions, please call your gastroenterologist to clarify.  If you requested that your care partner not be given the details of your procedure findings, then the procedure report has been included in a sealed envelope for you to review at your convenience later.  YOU SHOULD EXPECT: Some feelings of bloating in the abdomen. Passage of more gas than usual.  Walking can help get rid of the air that was put into your GI tract during the procedure and reduce the bloating. If you had a lower endoscopy (such as a colonoscopy or flexible sigmoidoscopy) you may notice spotting of blood in your stool or on the toilet paper. If you underwent a bowel prep for your procedure, you may not have a normal bowel movement for a few days.  Please Note:  You might notice some irritation and congestion in your nose or some drainage.  This is from the oxygen used during your procedure.  There is no need for concern and it should clear up in a day or so.  SYMPTOMS TO REPORT IMMEDIATELY:   Following lower endoscopy (colonoscopy or flexible sigmoidoscopy):  Excessive amounts of blood in the stool  Significant tenderness or worsening of abdominal pains  Swelling of the abdomen that is new, acute  Fever of 100F or higher   Please read all handouts given to you by your recovery nurse today.    For urgent or emergent issues, a gastroenterologist can be reached at any hour by calling [phone].   DIET:  We do recommend a small meal at first, but then you may proceed to your regular diet.  Drink plenty of fluids but you should avoid alcoholic beverages for 24 hours.  ACTIVITY:  You should plan to take it easy for the rest of today and you should NOT DRIVE or use heavy machinery  until tomorrow (because of the sedation medicines used during the test).    FOLLOW UP: Our staff will call the number listed on your records the next business day following your procedure to check on you and address any questions or concerns that you may have regarding the information given to you following your procedure. If we do not reach you, we will leave a message.  However, if you are feeling well and you are not experiencing any problems, there is no need to return our call.  We will assume that you have returned to your regular daily activities without incident.  If any biopsies were taken you will be contacted by phone or by letter within the next 1-3 weeks.  Please call us at [phone] if you have not heard about the biopsies in 3 weeks.    SIGNATURES/CONFIDENTIALITY: You and/or your care partner have signed paperwork which will be entered into your electronic medical record.  These signatures attest to the fact that that the information above on your After Visit Summary has been reviewed and is understood.  Full responsibility of the confidentiality of this discharge information lies with you and/or your care-partner.  Thank you for letting us take care of your healthcare needs today.

## 2017-06-24 NOTE — Progress Notes (Signed)
Called to room to assist during endoscopic procedure.  Patient ID and intended procedure confirmed with present staff. Received instructions for my participation in the procedure from the performing physician.  

## 2017-06-24 NOTE — Progress Notes (Signed)
To PACU< VSS. Report to [email or web address] 

## 2017-06-25 ENCOUNTER — Telehealth: Payer: Self-pay | Admitting: *Deleted

## 2017-06-25 ENCOUNTER — Telehealth: Payer: Self-pay

## 2017-06-25 NOTE — Telephone Encounter (Signed)
Message left

## 2017-06-25 NOTE — Telephone Encounter (Signed)
  Follow up Call-  Call back number 06/24/2017  Post procedure Call Back phone  # [phone]  Permission to leave phone message Yes  Some recent data might be hidden     Left message

## 2017-07-08 ENCOUNTER — Encounter: Payer: Medicare Other | Admitting: Internal Medicine

## 2017-07-20 ENCOUNTER — Ambulatory Visit (HOSPITAL_BASED_OUTPATIENT_CLINIC_OR_DEPARTMENT_OTHER): Payer: Medicare Other | Attending: Pulmonary Disease | Admitting: Pulmonary Disease

## 2017-07-20 VITALS — Ht 72.0 in | Wt 250.0 lb

## 2017-07-20 DIAGNOSIS — R0902 Hypoxemia: Secondary | ICD-10-CM | POA: Insufficient documentation

## 2017-07-20 DIAGNOSIS — G4734 Idiopathic sleep related nonobstructive alveolar hypoventilation: Secondary | ICD-10-CM

## 2017-07-20 DIAGNOSIS — G4733 Obstructive sleep apnea (adult) (pediatric): Secondary | ICD-10-CM

## 2017-07-20 DIAGNOSIS — R0683 Snoring: Secondary | ICD-10-CM | POA: Diagnosis not present

## 2017-07-23 DIAGNOSIS — R0683 Snoring: Secondary | ICD-10-CM | POA: Insufficient documentation

## 2017-07-23 NOTE — Procedures (Signed)
   Patient Name: [name] Jenkins, [name] Jenkins Date: 07/20/2017 Gender: Male D.O.B: 23-Oct-1966 Age (years): 50 Referring Provider: Javier Glazier Height (inches): 26 Interpreting Physician: Chesley Mires MD, ABSM Weight (lbs): 250 RPSGT: Jonna Coup BMI: 34 MRN: [id number] Neck Size: 16.50  CLINICAL INFORMATION Sleep Study Type: NPSG  51 yo male with recent overnight oximetry showing oxygen desaturation.  He is referred to the sleep lab to assess for obstructive sleep apnea.  Epworth Sleepiness Score: 9  SLEEP STUDY TECHNIQUE As per the AASM Manual for the Scoring of Sleep and Associated Events v2.3 (April 2016) with a hypopnea requiring 4% desaturations.  The channels recorded and monitored were frontal, central and occipital EEG, electrooculogram (EOG), submentalis EMG (chin), nasal and oral airflow, thoracic and abdominal wall motion, anterior tibialis EMG, snore microphone, electrocardiogram, and pulse oximetry.  MEDICATIONS Medications self-administered by patient taken the night of the study : N/A  SLEEP ARCHITECTURE The study was initiated at 10:28:35 PM and ended at 4:35:04 AM.  Sleep onset time was 8.4 minutes and the sleep efficiency was 84.2%. The total sleep time was 308.6 minutes.  Stage REM latency was 118.5 minutes.  The patient spent 2.92% of the night in stage N1 sleep, 81.53% in stage N2 sleep, 0.16% in stage N3 and 15.39% in REM.  Alpha intrusion was absent.  Supine sleep was 66.79%.  RESPIRATORY PARAMETERS The overall apnea/hypopnea index (AHI) was 4.3 per hour. There were 0 total apneas, including 0 obstructive, 0 central and 0 mixed apneas. There were 22 hypopneas and 0 RERAs.  The AHI during Stage REM sleep was 2.5 per hour.  AHI while supine was 5.8 per hour.  The mean oxygen saturation was 91.64%. The minimum SpO2 during sleep was 85.00%.  Moderate snoring was noted during this study.  CARDIAC DATA The 2 lead EKG demonstrated sinus rhythm.  The mean heart rate was 87.44 beats per minute. Other EKG findings include: None.  LEG MOVEMENT DATA The total PLMS were 61 with a resulting PLMS index of 11.86. Associated arousal with leg movement index was 4.3 .  IMPRESSIONS - While he had several respiratory events, these were not frequent enough to qualify for a diagnosis of obstructive sleep apnea.  His overall AHI was 4.3 with an SpO2 low of 85%.  He spent 0.3 minutes with an SpO2 < 88%.  He did not require supplemental oxygen during this test.  DIAGNOSIS - Snoring (R06.83)  RECOMMENDATIONS - Avoid alcohol, sedatives and other CNS depressants that may worsen sleep apnea and disrupt normal sleep architecture. - Sleep hygiene should be reviewed to assess factors that may improve sleep quality. - Weight management and regular exercise should be initiated or continued if appropriate.  [Electronically signed] 07/23/2017 02:31 PM  Chesley Mires MD, [place], American Board of Sleep Medicine   NPI: [id number]

## 2017-07-25 ENCOUNTER — Telehealth: Payer: Self-pay | Admitting: Pulmonary Disease

## 2017-07-25 NOTE — Telephone Encounter (Signed)
Spoke with pt, aware of results/recs.  Nothing further needed.  

## 2017-07-25 NOTE — Telephone Encounter (Signed)
[name] Glazier, MD  Tyson Dense, RN        Please let the patient know that his sleep study was normal and did not show any evidence of sleep apnea or significant desaturation. Thank you.   ---------------------------------------------- lmtcb x1 for pt.

## 2017-07-25 NOTE — Telephone Encounter (Signed)
Pt returned phone call, pt contact # [phone]

## 2017-08-06 ENCOUNTER — Encounter: Payer: Medicare Other | Admitting: Gastroenterology

## 2017-08-19 NOTE — Progress Notes (Deleted)
Subjective:    Patient ID: [name] Jenkins, male    DOB: 03-28-1966, 51 y.o.   MRN: [id number]  C.C.:  Follow-up for Dyspnea, Chronic Allergic Rhinitis, GERD, Mild Restrictive Lung Disease, & Nocturnal Hypoxia.  HPI Dyspnea: Likely multifactorial. Recommended weight loss and exercise regimen at last appointment.  Chronic allergic rhinitis: Previously prescribed Singulair with good control.  GERD: Recommended over-the-counter Zantac or Pepcid at last appointment. Previously referred to GI for intermittent hematochezia.  Mild restrictive lung disease: No evidence of ILD on high-resolution CT imaging. Likely due to central obesity. No further testing necessary.  Nocturnal hypoxia: No evidence of sleep apnea on polysomnogram. No significant time with saturation less than or equal to 88%.   Review of Systems    No Known Allergies  Current Outpatient Prescriptions on File Prior to Visit  Medication Sig Dispense Refill  . alprazolam (XANAX) 2 MG tablet Take 2 tablets by mouth 2 (two) times daily.    . divalproex (DEPAKOTE ER) 500 MG 24 hr tablet Take by mouth daily. 100 mg tablets, takes four daily    . FLUoxetine (PROZAC) 40 MG capsule Take 2 capsules by mouth daily.    Marland Kitchen lisdexamfetamine (VYVANSE) 70 MG capsule Take 1 capsule by mouth daily.    . meloxicam (MOBIC) 15 MG tablet Take 1 tablet by mouth at bedtime.    . montelukast (SINGULAIR) 10 MG tablet TAKE 1 TABLET (10 MG TOTAL) BY MOUTH AT BEDTIME. (Patient not taking: Reported on 06/24/2017) 30 tablet 3  . Oxycodone HCl 10 MG TABS Take 1 tablet by mouth every 4 (four) hours as needed.    . OXYCONTIN 20 MG 12 hr tablet Take 1 tablet by mouth 2 times daily at 12 noon and 4 pm.    . QUEtiapine (SEROQUEL) 200 MG tablet Take by mouth. 200mg  in morning and 500 before bed    . zolpidem (AMBIEN) 10 MG tablet Take 1 tablet by mouth daily.     Current Facility-Administered Medications on File Prior to Visit  Medication Dose Route Frequency  Provider Last Rate Last Dose  . 0.9 %  sodium chloride infusion  500 mL Intravenous Continuous Milus Banister, MD        Past Medical History:  Diagnosis Date  . Arthritis   . Depression   . GERD (gastroesophageal reflux disease)     Past Surgical History:  Procedure Laterality Date  . none      Family History  Problem Relation Age of Onset  . Lung disease Mother   . Lung cancer Father   . Emphysema Maternal Grandfather   . Heart disease Maternal Grandfather     Social History   Social History  . Marital status: Married    Spouse name: N/A  . Number of children: 5  . Years of education: N/A   Social History Main Topics  . Smoking status: Never Smoker  . Smokeless tobacco: Never Used  . Alcohol use Yes     Comment: occ  . Drug use: No  . Sexual activity: Not on file   Other Topics Concern  . Not on file   Social History Narrative   [facility] (01/16/17):   Originally from [place], [place]. Has always lived in [place]. 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 No asbestos or mold exposure. Currently has a dog. No bird or hot tub exposure. Does have carpet  in the bedrooms. No indoor plants. Does have curtains. No down pillows or bedding. Remotely did have a down pillow.       Objective:   Physical Exam There were no vitals taken for this visit.  General:  Awake. Alert. No acute distress. Sitting watching TV. Family at bedside.  Integument:  Warm & dry. No rash on exposed skin. No bruising. Extremities:  No cyanosis or clubbing.  HEENT:  Moist mucus membranes. No oral ulcers. No scleral injection or icterus. Endotracheal tube in place. PERRL. Cardiovascular:  Regular rate. No edema. No appreciable JVD.  Pulmonary:  Good aeration & clear to auscultation bilaterally. Symmetric chest wall expansion. No accessory muscle use. Abdomen: Soft. Normal bowel sounds.  Nondistended. Grossly nontender. Musculoskeletal:  Normal bulk and tone. Hand grip strength 5/5 bilaterally. No joint deformity or effusion appreciated.  *** General:  Awake. Alert. No acute distress. Central obesity. Integument:  Warm & dry. No rash on exposed skin. No bruising on exposed skin:. Extremities:  No cyanosis or clubbing.  HEENT:  Moist mucus membranes. No oral ulcers. No scleral injection or icterus. Mild bilateral nasal turbinate swelling. Cardiovascular:  Regular rate. No edema. Normal S1 & S2.  Pulmonary:  Good aeration & clear to auscultation bilaterally. Symmetric chest wall expansion. No accessory muscle use on room air. Abdomen: Soft. Normal bowel sounds. Protuberant. Musculoskeletal:  Normal bulk and tone. No joint deformity or effusion appreciated.  PFT 04/09/17: FVC 4.22 L (78%) FEV1 3.23 L (78%) FEV1/FVC 0.77 FEF 25-65 2.69 L (74%) negative bronchodilator response TLC 5.75 L (78%) RV 102% ERV 97% DLCO corrected 79%  6MWT 02/13/17:  Walked 460 meters / Baseline Sat 98% on RA / Nadir Sat 98% on RA (patient c/o dizziness with exertion)  OVERNIGHT OXIMETRY 05/03/17:  Performed on room air. Lowest saturation 79%. 104.5 minutes with saturation </= 88%.   POLYSOMNOGRAM (07/20/17):  Overall AHI was 4.3 with an SpO2 low of 85%.  He spent 0.3 minutes with an SpO2 < 88%.  METHACHOLINE CHALLENGE TEST (04/15/17): Patient effort erratic. Normal spirometry. Patient did have 20% reduction in FEV1 at the level I dilution but results questionable due to marginal effort.  IMAGING HRCT CHEST W/O 04/22/17 (previously reviewed by me):  No intralobular septal thickening, bronchiectasis, or honeycombing changes. Minimal linear scarring in the lower lung zones. No parenchymal nodule or opacity appreciated. No pleural effusion. No pleural thickening. No pathologic mediastinal adenopathy. No pericardial effusion.  CXR PA/LAT 11/22/16 (previously reviewed by me): No parenchymal nodule, opacity,  or mass appreciated. No pleural effusion. Somewhat low lung volumes. Heart normal in size & mediastinum normal in contour.  LABS 02/13/17 CBC: 6.6/16.3/46.9/211 Eosinophils: 0.1 IgE: 650 RAST panel:  [place] 0.16 / D farinae 0.22    Assessment & Plan:  51 y.o.   male with dyspnea, chronic allergic rhinitis, & GERD. Patient's restrictive lung disease is likely due to his central obesity. No evidence of interstitial lung disease on high-resolution CT imaging. Questionable effort during methacholine challenge test despite significant reduction in FEV1 at the level I dilution. Patient's overnight oximetry does show significant nocturnal hypoxia which could indicate obstructive sleep apnea. Especially in the setting of his poor sleep quality. This could certainly be contributing overall to his dyspnea on exertion. I instructed the patient contact my office if he had any new breathing problems before his next appointment.  1. Dyspnea: 2. Chronic allergic rhinitis: 3. GERD: 4. Follow-up: Return to clinic in  5. Dyspnea: Likely multifactorial. Holding off on  further testing or inhaler medications at this time. 6. Nocturnal hypoxia: Checking split night sleep study for probable underlying OSA. 7. Chronic allergic rhinitis: Reasonably controlled on Singulair. No new medications. 8. GERD: Recommended trying over-the-counter Zantac or Pepcid daily at bedtime. 9. Restrictive lung disease: No need for further testing. 10. Follow-up: Return to clinic in 3 months or sooner if needed.  Sonia Baller Ashok Cordia, M.D. [facility] Pager:  [phone] After 3pm or if no response, call [phone] 5:43 PM 08/19/17

## 2017-08-20 ENCOUNTER — Ambulatory Visit: Payer: Medicare Other | Admitting: Pulmonary Disease

## 2017-08-25 ENCOUNTER — Encounter: Payer: Medicare Other | Admitting: Gastroenterology

## 2017-09-01 ENCOUNTER — Encounter: Payer: Self-pay | Admitting: Pulmonary Disease

## 2017-09-01 ENCOUNTER — Ambulatory Visit (INDEPENDENT_AMBULATORY_CARE_PROVIDER_SITE_OTHER): Payer: Medicare Other | Admitting: Pulmonary Disease

## 2017-09-01 VITALS — BP 130/72 | HR 103 | Ht 72.0 in | Wt 249.4 lb

## 2017-09-01 DIAGNOSIS — J309 Allergic rhinitis, unspecified: Secondary | ICD-10-CM

## 2017-09-01 DIAGNOSIS — K219 Gastro-esophageal reflux disease without esophagitis: Secondary | ICD-10-CM

## 2017-09-01 DIAGNOSIS — R0600 Dyspnea, unspecified: Secondary | ICD-10-CM | POA: Diagnosis not present

## 2017-09-01 NOTE — Progress Notes (Signed)
Subjective:    Patient ID: [name] Jenkins, male    DOB: [birth year], 51 y.o.   MRN: [id number]  C.C.:  Follow-up for Dyspnea, Chronic Allergic Rhinitis, GERD, Mild Restrictive Lung Disease, & Nocturnal Hypoxia.  HPI Dyspnea: Likely multifactorial. Recommended weight loss and exercise regimen at last appointment. He is still having dyspnea on exertion. His weight hasn't changed significantly. He reports now he is only walking for exercise.   Chronic allergic rhinitis: Previously prescribed Singulair with good control. He has been using a nasal saline rinse intermittently. He reports only intermittent sinus congestion.   GERD: Recommended over-the-counter Zantac or Pepcid at last appointment. Previously referred to GI for intermittent hematochezia.Denies any reflux or dyspepsia.  Mild restrictive lung disease: No evidence of ILD on high-resolution CT imaging. Likely due to central obesity. No further testing necessary.  Nocturnal hypoxia: No evidence of sleep apnea on polysomnogram. No significant time with saturation less than or equal to 88% on polysomnogram either.   Review of Systems  He does feel some chest discomfort with tachypnea. No frank chest pain. No fever or chills. No abdominal pain or nausea.   No Known Allergies  Current Outpatient Prescriptions on File Prior to Visit  Medication Sig Dispense Refill  . alprazolam (XANAX) 2 MG tablet Take 2 tablets by mouth 2 (two) times daily.    . divalproex (DEPAKOTE ER) 500 MG 24 hr tablet Take by mouth daily. 100 mg tablets, takes four daily    . FLUoxetine (PROZAC) 40 MG capsule Take 2 capsules by mouth daily.    Marland Kitchen lisdexamfetamine (VYVANSE) 70 MG capsule Take 1 capsule by mouth daily.    . meloxicam (MOBIC) 15 MG tablet Take 1 tablet by mouth at bedtime.    . montelukast (SINGULAIR) 10 MG tablet TAKE 1 TABLET (10 MG TOTAL) BY MOUTH AT BEDTIME. 30 tablet 3  . Oxycodone HCl 10 MG TABS Take 1 tablet by mouth every 4 (four) hours as  needed.    . OXYCONTIN 20 MG 12 hr tablet Take 1 tablet by mouth 2 times daily at 12 noon and 4 pm.    . QUEtiapine (SEROQUEL) 200 MG tablet Take by mouth. 200mg  in morning and 500 before bed    . zolpidem (AMBIEN) 10 MG tablet Take 1 tablet by mouth daily.     Current Facility-Administered Medications on File Prior to Visit  Medication Dose Route Frequency Provider Last Rate Last Dose  . 0.9 %  sodium chloride infusion  500 mL Intravenous Continuous Milus Banister, MD        Past Medical History:  Diagnosis Date  . Arthritis   . Depression   . GERD (gastroesophageal reflux disease)     Past Surgical History:  Procedure Laterality Date  . none      Family History  Problem Relation Age of Onset  . Lung disease Mother   . Lung cancer Father   . Emphysema Maternal Grandfather   . Heart disease Maternal Grandfather     Social History   Social History  . Marital status: Married    Spouse name: N/A  . Number of children: 5  . Years of education: N/A   Social History Main Topics  . Smoking status: Never Smoker  . Smokeless tobacco: Never Used  . Alcohol use Yes     Comment: occ  . Drug use: No  . Sexual activity: Not Asked   Other Topics Concern  . None   Social History Narrative  [facility] (01/16/17):   Originally from [place], [place]. Has always lived in [place]. 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 No asbestos or mold exposure. Currently has a dog. No bird or hot tub exposure. Does have carpet in the bedrooms. No indoor plants. Does have curtains. No down pillows or bedding. Remotely did have a down pillow.       Objective:   Physical Exam BP 130/72 (BP Location: Left Arm, Cuff Size: Normal)   Pulse (!) 103   Ht 6' (1.829 m)   Wt 249 lb 6 oz (113.1 kg)   SpO2 97%   BMI 33.82 kg/m   General:  Awake. Alert. Central obesity. No distress. Integument:   Warm. Dry. No bruising on exposed skin. Extremities:  No cyanosis or clubbing.  HEENT:  Moist mucus membranes. No significant nasal turbinate swelling. No scleral injection or icterus. Cardiovascular:  Regular rate & rhythm. No edema. No appreciable JVD.  Pulmonary:  Clear bilaterally to auscultation. Normal work of breathing on room air. Abdomen: Soft. Normal bowel sounds. Protuberant. Musculoskeletal:  Normal bulk and tone. No joint deformity or effusion appreciated.  PFT 04/09/17: FVC 4.22 L (78%) FEV1 3.23 L (78%) FEV1/FVC 0.77 FEF 25-65 2.69 L (74%) negative bronchodilator response TLC 5.75 L (78%) RV 102% ERV 97% DLCO corrected 79%  6MWT 02/13/17:  Walked 460 meters / Baseline Sat 98% on RA / Nadir Sat 98% on RA (patient c/o dizziness with exertion)  OVERNIGHT OXIMETRY 05/03/17:  Performed on room air. Lowest saturation 79%. 104.5 minutes with saturation </= 88%.   POLYSOMNOGRAM (07/20/17):  Overall AHI was 4.3 with an SpO2 low of 85%.  He spent 0.3 minutes with an SpO2 < 88%.  METHACHOLINE CHALLENGE TEST (04/15/17): Patient effort erratic. Normal spirometry. Patient did have 20% reduction in FEV1 at the level I dilution but results questionable due to marginal effort.  IMAGING HRCT CHEST W/O 04/22/17 (previously reviewed by me):  No intralobular septal thickening, bronchiectasis, or honeycombing changes. Minimal linear scarring in the lower lung zones. No parenchymal nodule or opacity appreciated. No pleural effusion. No pleural thickening. No pathologic mediastinal adenopathy. No pericardial effusion.  CXR PA/LAT 11/22/16 (previously reviewed by me): No parenchymal nodule, opacity, or mass appreciated. No pleural effusion. Somewhat low lung volumes. Heart normal in size & mediastinum normal in contour.  LABS 02/13/17 CBC: 6.6/16.3/46.9/211 Eosinophils: 0.1 IgE: 650 RAST panel:  [place] 0.16 / D farinae 0.22    Assessment & Plan:  51 y.o. male with dyspnea as well as chronic  allergic rhinitis and GERD. Reflux and allergies seem to be well-controlled. We discussed his dyspnea at length. I do feel this is due to physical deconditioning and his central obesity. We discussed cardiopulmonary exercise testing and he will notify me if he wishes to undergo this testing to provide him a more concrete answer. I also offered a referral for a second opinion which she declined. I instructed the patient to contact me if he had new questions or concerns as I would be happy to see him back.  1. Dyspnea:  Likely due to physical deconditioning. Patient considering cardiopulmonary exercise testing. 2. Chronic allergic rhinitis:  Patient continuing on Singulair. No new medications. 3. GERD:  Continuing on Zantac. 4. Follow-up: Return to clinic as needed.   Sonia Baller Ashok Cordia, M.D. [facility] Pager:  [phone] After 3pm or if no response, call  [phone] 10:44 AM 09/01/17

## 2017-09-01 NOTE — Patient Instructions (Signed)
   Think about the cardiopulmonary exercise test we discussed. If you decide you want to do the test let me know.  We will see you back as needed. Please call if you have any questions or concerns.

## 2017-10-05 ENCOUNTER — Other Ambulatory Visit: Payer: Self-pay | Admitting: Pulmonary Disease

## 2018-02-02 ENCOUNTER — Telehealth: Payer: Self-pay | Admitting: Adult Health

## 2018-02-02 MED ORDER — MONTELUKAST SODIUM 10 MG PO TABS: 10.0000 mg | ORAL_TABLET | Freq: Every day | ORAL | 5 refills | Status: DC

## 2018-02-02 NOTE — Telephone Encounter (Signed)
Received faxed refill request from [facility] for Singulair 10mg  qhs Last office visit 10.8.41 w/ JN, follow up to be as needed Refills sent for an additional 6 months with note that pt will need a follow up appt in Oct 2019  Nothing further needed; will sign off

## 2018-04-13 ENCOUNTER — Other Ambulatory Visit: Payer: Self-pay | Admitting: Orthopedic Surgery

## 2018-04-13 DIAGNOSIS — M25512 Pain in left shoulder: Secondary | ICD-10-CM

## 2018-04-15 ENCOUNTER — Ambulatory Visit
Admission: RE | Admit: 2018-04-15 | Discharge: 2018-04-15 | Disposition: A | Discharge status: Home / Self Care | Payer: Medicare Other | Source: Ambulatory Visit | Attending: Orthopedic Surgery | Admitting: Orthopedic Surgery

## 2018-04-15 DIAGNOSIS — M25512 Pain in left shoulder: Secondary | ICD-10-CM

## 2018-06-07 IMAGING — CT CT SHOULDER*L* W/O CM
1 series · 12 of 14 positions shown, 15 images · non-contrast
Comparison: None.

CLINICAL DATA: Left shoulder pain for 3 years

EXAM:
CT OF THE UPPER LEFT EXTREMITY WITHOUT CONTRAST
TECHNIQUE: Multidetector CT imaging of the upper left extremity was performed
according to the standard protocol.

[Series 2: soft tissue · axial · 0.51mm/px · z∈[-236,-38]mm · 12 of 119 slices shown, 15 images]
[im 10/119  soft-tissue]
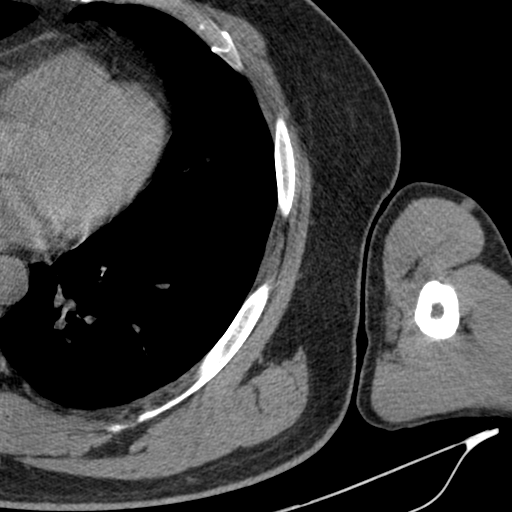
[im 10/119  bone]
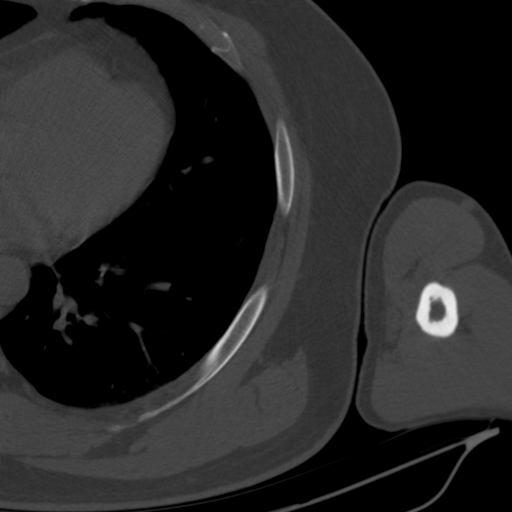
[im 19/119  bone]
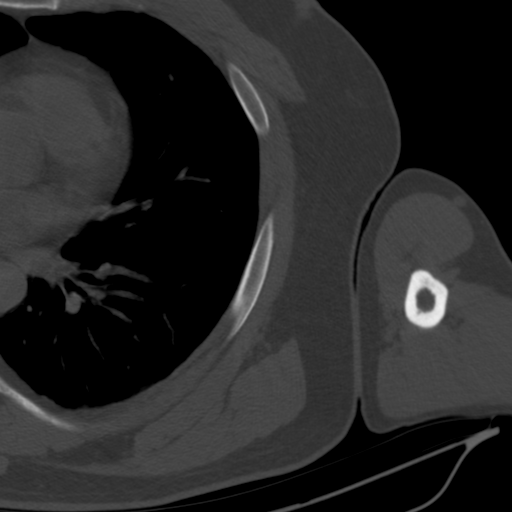
[im 28/119  bone]
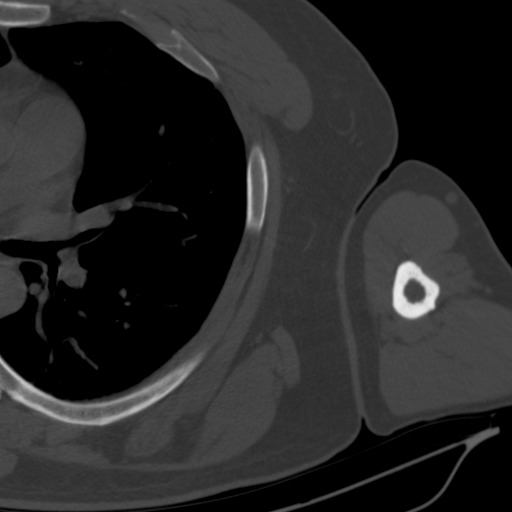
[im 37/119  bone]
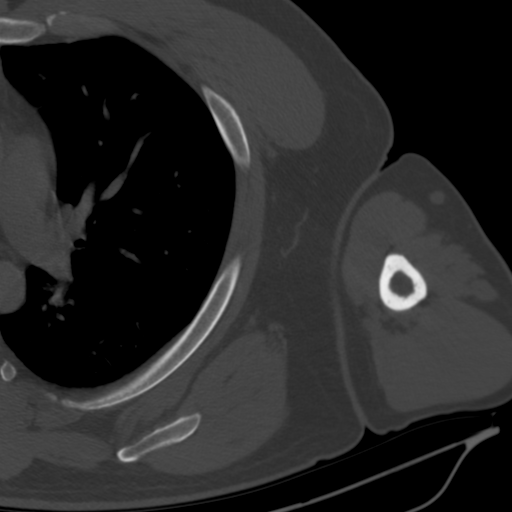
[im 46/119  soft-tissue]
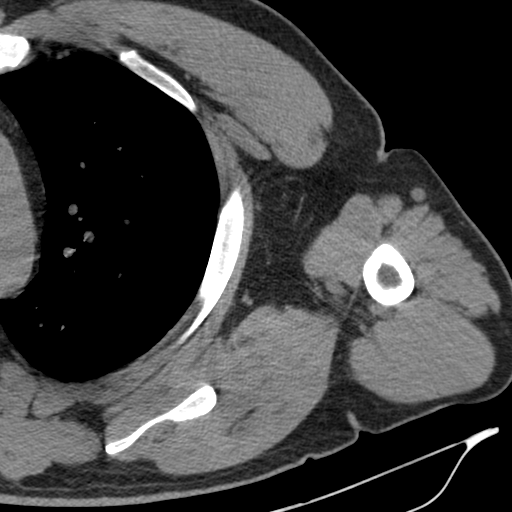
[im 46/119  bone]
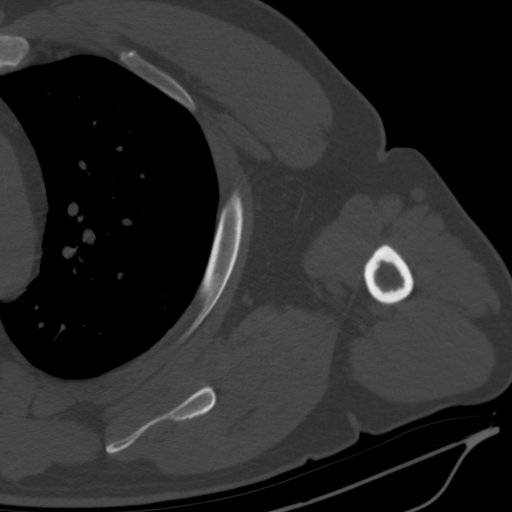
[im 55/119  bone]
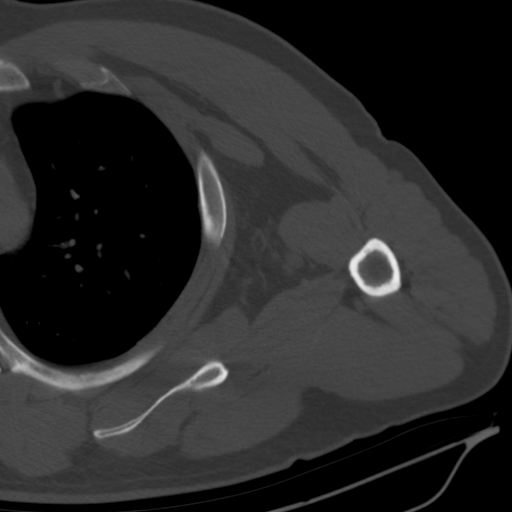
[im 64/119  bone]
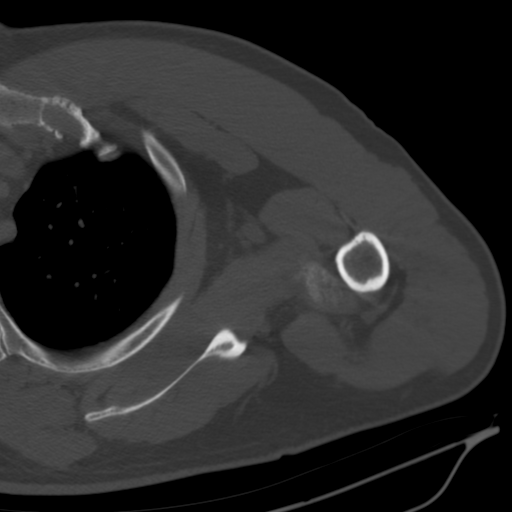
[im 73/119  bone]
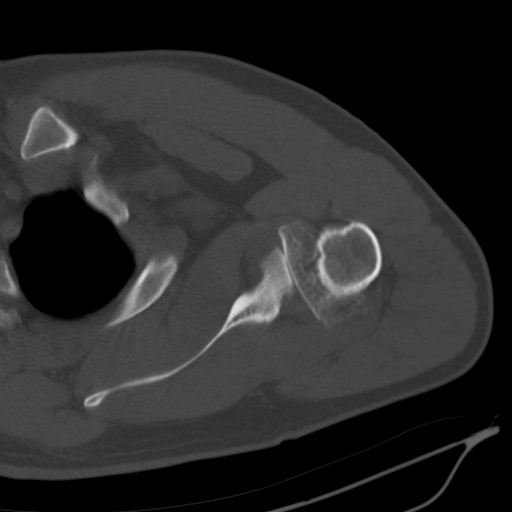
[im 82/119  soft-tissue]
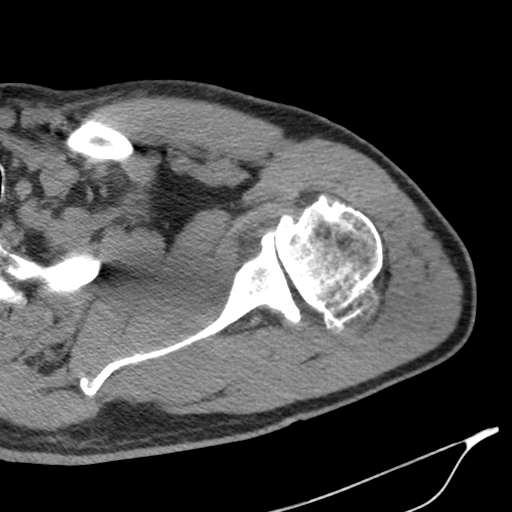
[im 82/119  bone]
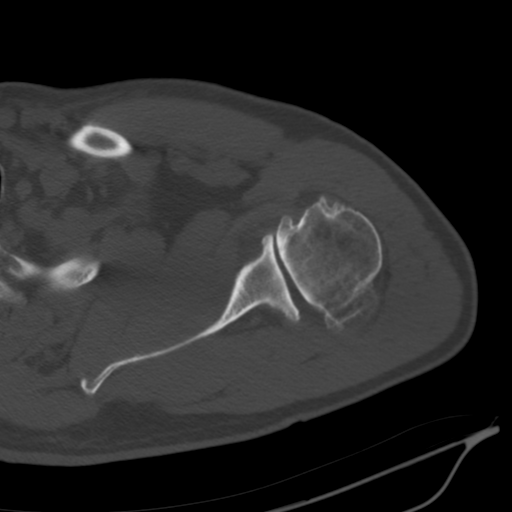
[im 91/119  bone]
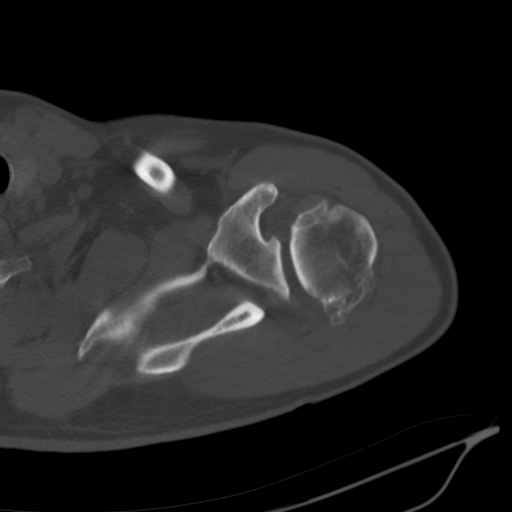
[im 100/119  bone]
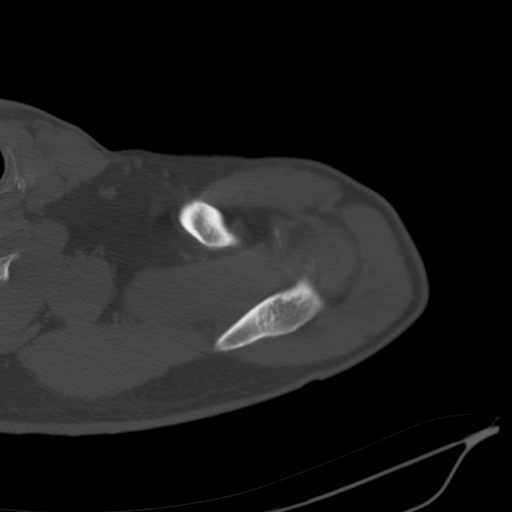
[im 109/119  bone]
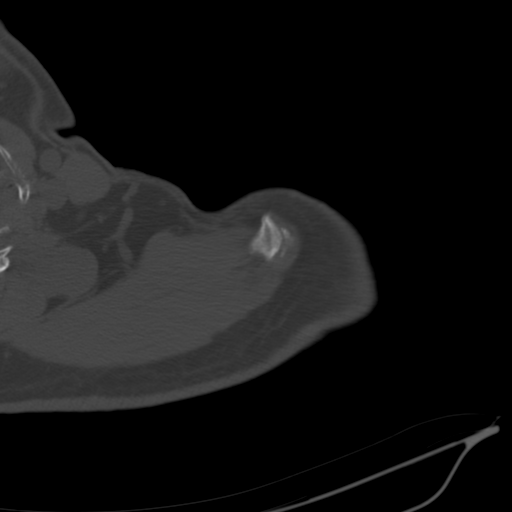

[12 of 14 positions shown; findings below may reference images not displayed]

FINDINGS: Bones/Joint/Cartilage

No fracture or dislocation. Normal alignment. No joint effusion.
Severe osteoarthritis of left glenohumeral joint with joint space
narrowing and large inferior humeral bulky marginal osteophyte.
Moderate arthropathy of the acromioclavicular joint.

Ligaments

Ligaments are suboptimally evaluated by CT.

Muscles and Tendons
Muscles are normal.  No muscle atrophy.

Soft tissue
No fluid collection or hematoma.  No soft tissue mass.
IMPRESSION: 1. Severe osteoarthritis of the left glenohumeral joint.

## 2018-07-10 ENCOUNTER — Other Ambulatory Visit: Payer: Self-pay | Admitting: Adult Health

## 2018-07-10 NOTE — Telephone Encounter (Signed)
Patient should be seen in October for yearly visit

## 2018-07-22 ENCOUNTER — Telehealth: Payer: Self-pay | Admitting: Adult Health

## 2018-07-22 NOTE — Telephone Encounter (Signed)
Former JN patient last seen 70.8.18  Received fax from [facility] requesting surgical clearance for left total shoulder replacement LMOM TCB x1 - patient needs to be seen in the office

## 2018-07-24 NOTE — Telephone Encounter (Signed)
LMTCB

## 2018-07-28 NOTE — Telephone Encounter (Signed)
Called patient unable to reach left message to give us a call back.

## 2018-07-29 NOTE — Telephone Encounter (Signed)
Called patient, unable to reach. Left message to give us a call back.  

## 2018-07-30 ENCOUNTER — Encounter: Payer: Self-pay | Admitting: Adult Health

## 2018-07-30 ENCOUNTER — Ambulatory Visit (INDEPENDENT_AMBULATORY_CARE_PROVIDER_SITE_OTHER): Payer: Medicare Other | Admitting: Adult Health

## 2018-07-30 ENCOUNTER — Ambulatory Visit (INDEPENDENT_AMBULATORY_CARE_PROVIDER_SITE_OTHER)
Admission: RE | Admit: 2018-07-30 | Discharge: 2018-07-30 | Disposition: A | Discharge status: Home / Self Care | Payer: Medicare Other | Source: Ambulatory Visit | Attending: Adult Health | Admitting: Adult Health

## 2018-07-30 VITALS — BP 130/80 | HR 108 | Ht 72.0 in | Wt 248.6 lb

## 2018-07-30 DIAGNOSIS — Z01818 Encounter for other preprocedural examination: Secondary | ICD-10-CM | POA: Diagnosis not present

## 2018-07-30 DIAGNOSIS — J310 Chronic rhinitis: Secondary | ICD-10-CM | POA: Diagnosis not present

## 2018-07-30 DIAGNOSIS — R0600 Dyspnea, unspecified: Secondary | ICD-10-CM

## 2018-07-30 DIAGNOSIS — R0602 Shortness of breath: Secondary | ICD-10-CM

## 2018-07-30 NOTE — Assessment & Plan Note (Signed)
Mild flare - suspect Fall allergies is major trigger  Declines nasal sprays  Trial of zyrtec   Plan  Patient Instructions  Chest xray today .  Begin Zyrtec 10mg  daily As needed  Drainage .  Continue on Singulair 10mg  At bedtime   Continue with saline nasal rinses  Follow up Dr. Ander Slade in 6 months and As needed   Good luck with your upcoming surgery .

## 2018-07-30 NOTE — Assessment & Plan Note (Signed)
Preop pulmonary clearance - Spirometry is normal in never smoker .  Previous sleep study neg for sleep apnea  Chest xray .  From pulmonary standpoint is cleared .  Continue on allergy meds.

## 2018-07-30 NOTE — Progress Notes (Signed)
@Patient  ID: [name] Jenkins, male    DOB: Dec 26, 1965, 52 y.o.   MRN: [id number]  Chief Complaint  Patient presents with  . Follow-up    sugical clearance     Referring provider: Chesley Noon, MD  HPI: 52 year old male former patient of Dr. Ashok Cordia followed for chronic allergic rhinitis dyspnea and mild restrictive lung disease  07/30/2018 Follow up ; [place] , Dyspnea and Surgical Clearance  Patient presents for a follow-up visit.  Patient has chronic rhinitis.  Is maintained on Singulair daily.  Previous IgE was elevated at 650.  Allergy profile was positive for dust and [place]. Patient says he still has some nasal congestion stuffiness and postnasal drainage. Feels that he does get short of breath at nighttime because it is hard to breathe in through his nose.  Previous work-up for asthma showed mild restriction with no airflow obstruction.  Patient did have a methacholine challenge test.  This was inconclusive as patient's efforts were very erratic.  Patient previously been tried on some inhalers without any significant clinical improvement.  Spirometry today is normal with no restriction or airflow obstruction. Previous CT chest showed no evidence of interstitial lung disease.  Postinflammatory scarring along the mid to lower lungs.   Patient is going to be having rotator cuff surgery soon.  Needs pulmonary surgical clearance today.  She said he had cardiac work-up last month.  Says he is active.  No cough or wheezing.  He is a never smoker.   Does have chronic insomnia on several sleep meds . Previous sleep study neg for OSA .     No Known Allergies  There is no immunization history for the selected administration types on file for this patient.  Past Medical History:  Diagnosis Date  . Arthritis   . Depression   . GERD (gastroesophageal reflux disease)     Tobacco History: Social History   Tobacco Use  Smoking Status Never Smoker  Smokeless Tobacco Never Used    Counseling given: Not Answered   Outpatient Medications Prior to Visit  Medication Sig Dispense Refill  . alprazolam (XANAX) 2 MG tablet Take 2 tablets by mouth 2 (two) times daily.    Marland Kitchen atorvastatin (LIPITOR) 20 MG tablet Take by mouth.    . divalproex (DEPAKOTE ER) 500 MG 24 hr tablet Take by mouth daily. 100 mg tablets, takes four daily    . ferrous sulfate 325 (65 FE) MG tablet Take 325 mg by mouth daily with breakfast.    . FLUoxetine (PROZAC) 40 MG capsule Take 2 capsules by mouth daily.    Marland Kitchen lisdexamfetamine (VYVANSE) 70 MG capsule Take 1 capsule by mouth daily.    . meloxicam (MOBIC) 15 MG tablet Take 1 tablet by mouth at bedtime.    . montelukast (SINGULAIR) 10 MG tablet TAKE 1 TABLET BY MOUTH EVERYDAY AT BEDTIME 90 tablet 1  . Oxycodone HCl 10 MG TABS Take 1 tablet by mouth every 4 (four) hours as needed.    . OXYCONTIN 20 MG 12 hr tablet Take 1 tablet by mouth 2 times daily at 12 noon and 4 pm.    . QUEtiapine (SEROQUEL) 200 MG tablet Take by mouth. 200mg  in morning and 500 before bed    . zolpidem (AMBIEN) 10 MG tablet Take 1 tablet by mouth daily.     No facility-administered medications prior to visit.      Review of Systems  Constitutional:   No  weight loss, night sweats,  Fevers,  chills, fatigue, or  lassitude.  HEENT:   No headaches,  Difficulty swallowing,  Tooth/dental problems, or  Sore throat,                No sneezing, itching, ear ache, nasal congestion, post nasal drip,   CV:  No chest pain,  Orthopnea, PND, swelling in lower extremities, anasarca, dizziness, palpitations, syncope.   GI  No heartburn, indigestion, abdominal pain, nausea, vomiting, diarrhea, change in bowel habits, loss of appetite, bloody stools.   Resp: No shortness of breath with exertion or at rest.  No excess mucus, no productive cough,  No non-productive cough,  No coughing up of blood.  No change in color of mucus.  No wheezing.  No chest wall deformity  Skin: no rash or  lesions.  GU: no dysuria, change in color of urine, no urgency or frequency.  No flank pain, no hematuria   MS:  No joint pain or swelling.  No decreased range of motion.  No back pain.    Physical Exam  BP 130/80   Pulse (!) 108   Ht 6' (1.829 m)   Wt 248 lb 9.6 oz (112.8 kg)   SpO2 97%   BMI 33.72 kg/m   GEN: A/Ox3; pleasant , NAD, well nourished    HEENT:  [place]/AT,  EACs-clear, TMs-wnl, NOSE- mild redness, turinate edema R>L , THROAT-clear, no lesions, no postnasal drip or exudate noted.   NECK:  Supple w/ fair ROM; no JVD; normal carotid impulses w/o bruits; no thyromegaly or nodules palpated; no lymphadenopathy.    RESP  Clear  P & A; w/o, wheezes/ rales/ or rhonchi. no accessory muscle use, no dullness to percussion  CARD:  RRR, no m/r/g, no peripheral edema, pulses intact, no cyanosis or clubbing.  GI:   Soft & nt; nml bowel sounds; no organomegaly or masses detected.   Musco: Warm bil, no deformities or joint swelling noted.   Neuro: alert, no focal deficits noted.    Skin: Warm, no lesions or rashes    Lab Results: CBC  BMET  No results found for: BNP  ProBNP No results found for: PROBNP  Imaging: No results found.   Assessment & Plan:   Chronic rhinitis Mild flare - suspect Fall allergies is major trigger  Declines nasal sprays  Trial of zyrtec   Plan  Patient Instructions  Chest xray today .  Begin Zyrtec 10mg  daily As needed  Drainage .  Continue on Singulair 10mg  At bedtime   Continue with saline nasal rinses  Follow up Dr. Ander Slade in 6 months and As needed   Good luck with your upcoming surgery .         Preoperative clearance Preop pulmonary clearance - Spirometry is normal in never smoker .  Previous sleep study neg for sleep apnea  Chest xray .  From pulmonary standpoint is cleared .  Continue on allergy meds.        Rexene Edison, NP 07/30/2018

## 2018-07-30 NOTE — Patient Instructions (Addendum)
Chest xray today .  Begin Zyrtec 10mg  daily As needed  Drainage .  Continue on Singulair 10mg  At bedtime   Continue with saline nasal rinses  Follow up Dr. Ander Slade in 6 months and As needed   Good luck with your upcoming surgery .

## 2018-07-30 NOTE — Telephone Encounter (Signed)
Patient seen in office today. Surgical form signed and faxed to [facility]. Will place form in scan folder. Nothing further needed at this time.

## 2018-07-31 ENCOUNTER — Telehealth: Payer: Self-pay | Admitting: Adult Health

## 2018-07-31 NOTE — Telephone Encounter (Signed)
Pt is calling back [phone]

## 2018-07-31 NOTE — Telephone Encounter (Signed)
Notes recorded by Melvenia Needles, NP on 07/30/2018 at 11:16 AM EDT Chest xray is clear  Cont w/ ov recs Please contact office for sooner follow up if symptoms do not improve or worsen or seek emergency care   lmtcb for pt.

## 2018-07-31 NOTE — Telephone Encounter (Signed)
Spoke with patient, patient made aware of CXR per TP. Voiced understanding. Nothing further needed at this time.

## 2018-09-07 DIAGNOSIS — F909 Attention-deficit hyperactivity disorder, unspecified type: Secondary | ICD-10-CM | POA: Diagnosis present

## 2018-09-07 DIAGNOSIS — F3181 Bipolar II disorder: Secondary | ICD-10-CM | POA: Diagnosis present

## 2018-09-07 DIAGNOSIS — M19011 Primary osteoarthritis, right shoulder: Secondary | ICD-10-CM | POA: Diagnosis present

## 2018-09-07 DIAGNOSIS — M19012 Primary osteoarthritis, left shoulder: Secondary | ICD-10-CM | POA: Diagnosis present

## 2018-09-07 DIAGNOSIS — G8929 Other chronic pain: Secondary | ICD-10-CM | POA: Diagnosis present

## 2018-09-07 NOTE — H&P (Signed)
SHOULDER ARTHROPLASTY ADMISSION H&P  Patient ID: [name] Jenkins MRN: [id number] DOB/AGE: 02-07-1966 52 y.o.  Chief Complaint: left shoulder pain.  Planned Procedure Date: 09/29/2018 Medical Clearance by Dr. Melford Aase Cardiac Clearance by Dr. Maudie Mercury Additional clearance by pulmonology-Dr. Ashok Cordia Chronic Pain medications prescribed by Dr. Lynann Bologna  HPI: [name] Jenkins is a 52 y.o. male who presents for evaluation of OA LEFT SHOULDER. The patient has a history of pain and functional disability in the left shoulder due to arthritis and has failed non-surgical conservative treatments for greater than 12 weeks to include NSAID's and/or analgesics, corticosteriod injections and activity modification.  Onset of symptoms was gradual, starting > 2 years ago with gradually worsening course since that time.   Patient currently rates pain at 8 out of 10 with activity. Patient has night pain, worsening of pain with activity and weight bearing, pain that interferes with activities of daily living and pain with passive range of motion.  Patient has evidence of subchondral cysts, subchondral sclerosis, periarticular osteophytes and joint space narrowing by imaging studies.  There is no active infection.  Past Medical History:  Diagnosis Date  . Arthritis   . Depression   . GERD (gastroesophageal reflux disease)    Past Surgical History:  Procedure Laterality Date  . none     No Known Allergies   Prior to Admission medications   Medication Sig Start Date End Date Taking? Authorizing Provider  alprazolam Duanne Moron) 2 MG tablet Take 2 tablets by mouth 2 (two) times daily. 12/17/16   [provider]  atorvastatin (LIPITOR) 20 MG tablet Take by mouth. 05/17/18   [provider]  divalproex (DEPAKOTE ER) 500 MG 24 hr tablet Take by mouth daily. 100 mg tablets, takes four daily    [provider]  ferrous sulfate 325 (65 FE) MG tablet Take 325 mg by mouth daily with breakfast.    [provider]  FLUoxetine (PROZAC) 40 MG capsule Take 2 capsules by mouth daily. 12/13/16   [provider]  lisdexamfetamine (VYVANSE) 70 MG capsule Take 1 capsule by mouth daily.    [provider]  meloxicam (MOBIC) 15 MG tablet Take 1 tablet by mouth at bedtime. 12/06/16   [provider]  montelukast (SINGULAIR) 10 MG tablet TAKE 1 TABLET BY MOUTH EVERYDAY AT BEDTIME 07/10/18   Parrett, Tammy S, NP  Oxycodone HCl 10 MG TABS Take 1 tablet by mouth every 4 (four) hours as needed. 05/09/15   [provider]  OXYCONTIN 20 MG 12 hr tablet Take 1 tablet by mouth 2 times daily at 12 noon and 4 pm. 01/11/17   [provider]  QUEtiapine (SEROQUEL) 200 MG tablet Take by mouth. 200mg  in morning and 500 before bed    [provider]  zolpidem (AMBIEN) 10 MG tablet Take 1 tablet by mouth daily. 01/08/17   [provider]   Social History   Socioeconomic History  . Marital status: Married    Spouse name: Not on file  . Number of children: 5  . Years of education: Not on file  . Highest education level: Not on file  Occupational History  . Not on file  Social Needs  . Financial resource strain: Not on file  . Food insecurity:    Worry: Not on file    Inability: Not on file  . Transportation needs:    Medical: Not on file    Non-medical: Not on file  Tobacco Use  . Smoking status: Never Smoker  .  Smokeless tobacco: Never Used  Substance and Sexual Activity  . Alcohol use: Yes    Comment: occ  . Drug use: No  . Sexual activity: Not on file  Lifestyle  . Physical activity:    Days per week: Not on file    Minutes per session: Not on file  . Stress: Not on file  Relationships  . Social connections:    Talks on phone: Not on file    Gets together: Not on file    Attends religious service: Not on file    Active member of club or organization: Not on file    Attends meetings of clubs or organizations: Not on file     Relationship status: Not on file  Other Topics Concern  . Not on file  Social History Narrative   [facility] (01/16/17):   Originally from [place], [place]. Has always lived in [place]. 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 No asbestos or mold exposure. Currently has a dog. No bird or hot tub exposure. Does have carpet in the bedrooms. No indoor plants. Does have curtains. No down pillows or bedding. Remotely did have a down pillow.    Family History  Problem Relation Age of Onset  . Lung disease Mother   . Lung cancer Father   . Emphysema Maternal Grandfather   . Heart disease Maternal Grandfather     ROS: Currently denies lightheadedness, dizziness, Fever, chills, CP, SOB.   No personal history of DVT, PE, MI, or CVA. No loose teeth or dentures All other systems have been reviewed and were otherwise currently negative with the exception of those mentioned in the HPI and as above.  Objective: Vitals: Ht: 6'1" Wt: 254 Temp: 98.1 BP: 142/99 Pulse: 103 O2 94% on room air.   Physical Exam: General: Alert, NAD.   HEENT: EOMI, Good Neck Extension  Pulm: No increased work of breathing.  Clear B/L A/P w/o crackle or wheeze.  CV: RRR, No m/g/r appreciated  GI: soft, NT, ND Neuro: Neuro without gross focal deficit.  Sensation intact distally Skin: No lesions in the area of chief complaint MSK/Surgical Site: left shoulder pain with range of motion.  Forward flexion/abduction approximately 80 with significant reduction of external rotation in forward flexion.  Internal rotation to back pocket.  External rotation to 0.  Mild AC pain.  Mild to moderate Biceps pain.  Reduced Rotator cuff strength.  NVI distally.  Imaging Review Plain radiographs demonstrate severe degenerative joint disease of bilateral shoulders.  He has significant flattening of the humeral heads.  Assessment: OA LEFT  SHOULDER Principal Problem:   Primary osteoarthritis, left shoulder Active Problems:   History of depression   Chronic pain   Primary osteoarthritis, right shoulder   Attention deficit hyperactivity disorder (ADHD)   Bipolar II disorder ([place])   Plan: Plan for Procedure(s): LEFT TOTAL SHOULDER ARTHROPLASTY  The patient history, physical exam, clinical judgement of the provider and imaging are consistent with end stage degenerative joint disease and anatomic vs reverse total joint arthroplasty is deemed medically necessary. The treatment options including medical management, injection therapy, and arthroplasty were discussed at length. The risks and benefits of Procedure(s): LEFT TOTAL SHOULDER ARTHROPLASTY were presented and reviewed.  The risks of nonoperative treatment, versus surgical intervention including but not limited to continued pain, aseptic loosening, stiffness, dislocation/subluxation, infection, bleeding, nerve injury, blood clots, cardiopulmonary complications, morbidity, mortality, among  others were discussed. The patient verbalizes understanding and wishes to proceed with the plan.  Patient is being admitted for inpatient treatment for surgery, pain control, PT, OT, prophylactic antibiotics, VTE prophylaxis, progressive ambulation, ADL's and discharge planning.   Dental prophylaxis discussed and recommended for 2 years postoperatively.   The patient does meet the criteria for TXA which will be used perioperatively.    DVT prophylaxis with SCDs, and early ambulation.  The patient is planning to be discharged home in care of his wife.  We will evaluate rotator cuff intraoperatively to determine if Reverse TSA is needed.  He will continue chronic pain medications and we will prescribe standard post op pain medication regimen: oxycodone, tylenol, gabapentin, Robaxin.  He would benefit from a nerve block for additional pain control.  Consider medrol taper.   Patient's  anticipated LOS is less than 2 midnights, meeting these requirements: - Younger than 79 - Lives within 1 hour of care - Has a competent adult at home to recover with post-op recover - NO history of  - Diabetes  - Coronary Artery Disease  - Heart failure  - Heart attack  - Stroke  - DVT/VTE  - Cardiac arrhythmia  - Respiratory Failure/COPD  - Renal failure  - Advanced Liver disease   Prudencio Burly III, PA-C 09/07/2018 12:48 PM

## 2018-09-21 NOTE — Progress Notes (Signed)
07/30/2018- noted in Epic-CXR 2 view  07/16/2018- on chart ECHO and Stress test from [facility]  04/22/2018- on chart EKG from [facility]

## 2018-09-21 NOTE — Patient Instructions (Addendum)
Shreyansh Tiffany  09/21/2018   Your procedure is scheduled on: Tuesday 09/29/2018  Report to [facility] Main  Entrance              Report to admitting at   0730 AM    Call this number if you have problems the morning of surgery [phone]    Remember: Do not eat food or drink liquids :After Midnight.               BRUSH YOUR TEETH MORNING OF SURGERY AND RINSE YOUR MOUTH OUT, NO CHEWING GUM CANDY OR MINTS.     Take these medicines the morning of surgery with A SIP OF WATER: Alprazolam (Xanax), Fluoxetine (Prozac), Quetiapine (Seroquel), Oxycodone, Oxycontin                                You may not have any metal on your body including hair pins and              piercings  Do not wear jewelry, make-up, lotions, powders or perfumes, deodorant                        Men may shave face and neck.   Do not bring valuables to the hospital. [place].  Contacts, dentures or bridgework may not be worn into surgery.  Leave suitcase in the car. After surgery it may be brought to your room.                    Please read over the following fact sheets you were given: _____________________________________________________________________             [facility] - Preparing for Surgery Before surgery, you can play an important role.  Because skin is not sterile, your skin needs to be as free of germs as possible.  You can reduce the number of germs on your skin by washing with CHG (chlorahexidine gluconate) soap before surgery.  CHG is an antiseptic cleaner which kills germs and bonds with the skin to continue killing germs even after washing. Please DO NOT use if you have an allergy to CHG or antibacterial soaps.  If your skin becomes reddened/irritated stop using the CHG and inform your nurse when you arrive at Short Stay. Do not shave (including legs and underarms) for at least 48 hours prior to the first CHG  shower.  You may shave your face/neck. Please follow these instructions carefully:  1.  Shower with CHG Soap the night before surgery and the  morning of Surgery.  2.  If you choose to wash your hair, wash your hair first as usual with your  normal  shampoo.  3.  After you shampoo, rinse your hair and body thoroughly to remove the  shampoo.                           4.  Use CHG as you would any other liquid soap.  You can apply chg directly  to the skin and wash                       Gently  with a scrungie or clean washcloth.  5.  Apply the CHG Soap to your body ONLY FROM THE NECK DOWN.   Do not use on face/ open                           Wound or open sores. Avoid contact with eyes, ears mouth and genitals (private parts).                       Wash face,  Genitals (private parts) with your normal soap.             6.  Wash thoroughly, paying special attention to the area where your surgery  will be performed.  7.  Thoroughly rinse your body with warm water from the neck down.  8.  DO NOT shower/wash with your normal soap after using and rinsing off  the CHG Soap.                9.  Pat yourself dry with a clean towel.            10.  Wear clean pajamas.            11.  Place clean sheets on your bed the night of your first shower and do not  sleep with pets. Day of Surgery : Do not apply any lotions/deodorants the morning of surgery.  Please wear clean clothes to the hospital/surgery center.  FAILURE TO FOLLOW THESE INSTRUCTIONS MAY RESULT IN THE CANCELLATION OF YOUR SURGERY PATIENT SIGNATURE_________________________________  NURSE SIGNATURE__________________________________  ________________________________________________________________________   Adam Phenix  An incentive spirometer is a tool that can help keep your lungs clear and active. This tool measures how well you are filling your lungs with each breath. Taking long deep breaths may help reverse or decrease the chance  of developing breathing (pulmonary) problems (especially infection) following:  A long period of time when you are unable to move or be active. BEFORE THE PROCEDURE   If the spirometer includes an indicator to show your best effort, your nurse or respiratory therapist will set it to a desired goal.  If possible, sit up straight or lean slightly forward. Try not to slouch.  Hold the incentive spirometer in an upright position. INSTRUCTIONS FOR USE  1. Sit on the edge of your bed if possible, or sit up as far as you can in bed or on a chair. 2. Hold the incentive spirometer in an upright position. 3. Breathe out normally. 4. Place the mouthpiece in your mouth and seal your lips tightly around it. 5. Breathe in slowly and as deeply as possible, raising the piston or the ball toward the top of the column. 6. Hold your breath for 3-5 seconds or for as long as possible. Allow the piston or ball to fall to the bottom of the column. 7. Remove the mouthpiece from your mouth and breathe out normally. 8. Rest for a few seconds and repeat Steps 1 through 7 at least 10 times every 1-2 hours when you are awake. Take your time and take a few normal breaths between deep breaths. 9. The spirometer may include an indicator to show your best effort. Use the indicator as a goal to work toward during each repetition. 10. After each set of 10 deep breaths, practice coughing to be sure your lungs are clear. If you have an incision (the cut made at the time of surgery), support  your incision when coughing by placing a pillow or rolled up towels firmly against it. Once you are able to get out of bed, walk around indoors and cough well. You may stop using the incentive spirometer when instructed by your caregiver.  RISKS AND COMPLICATIONS  Take your time so you do not get dizzy or light-headed.  If you are in pain, you may need to take or ask for pain medication before doing incentive spirometry. It is harder to  take a deep breath if you are having pain. AFTER USE  Rest and breathe slowly and easily.  It can be helpful to keep track of a log of your progress. Your caregiver can provide you with a simple table to help with this. If you are using the spirometer at home, follow these instructions: [place] IF:   You are having difficultly using the spirometer.  You have trouble using the spirometer as often as instructed.  Your pain medication is not giving enough relief while using the spirometer.  You develop fever of 100.5 F (38.1 C) or higher. SEEK IMMEDIATE MEDICAL CARE IF:   You cough up bloody sputum that had not been present before.  You develop fever of 102 F (38.9 C) or greater.  You develop worsening pain at or near the incision site. MAKE SURE YOU:   Understand these instructions.  Will watch your condition.  Will get help right away if you are not doing well or get worse. Document Released: 03/24/2007 Document Revised: 02/03/2012 Document Reviewed: 05/25/2007 [facility] Patient Information 2014 [place], [place].   ________________________________________________________________________

## 2018-09-22 ENCOUNTER — Encounter (HOSPITAL_COMMUNITY): Payer: Self-pay

## 2018-09-22 ENCOUNTER — Other Ambulatory Visit: Payer: Self-pay

## 2018-09-22 ENCOUNTER — Encounter (HOSPITAL_COMMUNITY)
Admission: RE | Admit: 2018-09-22 | Discharge: 2018-09-22 | Disposition: A | Discharge status: Home / Self Care | Payer: Medicare Other | Source: Ambulatory Visit | Attending: Orthopedic Surgery | Admitting: Orthopedic Surgery

## 2018-09-22 DIAGNOSIS — Z01812 Encounter for preprocedural laboratory examination: Secondary | ICD-10-CM | POA: Diagnosis not present

## 2018-09-22 HISTORY — DX: Anxiety disorder, unspecified: F41.9

## 2018-09-22 LAB — CBC
HCT: 47.3 % (ref 39.0–52.0)
HEMOGLOBIN: 15 g/dL (ref 13.0–17.0)
MCH: 27.8 pg (ref 26.0–34.0)
MCHC: 31.7 g/dL (ref 30.0–36.0)
MCV: 87.6 fL (ref 80.0–100.0)
Platelets: 206 10*3/uL (ref 150–400)
RBC: 5.4 MIL/uL (ref 4.22–5.81)
RDW: 14 % (ref 11.5–15.5)
WBC: 6.4 10*3/uL (ref 4.0–10.5)
nRBC: 0 % (ref 0.0–0.2)

## 2018-09-22 LAB — BASIC METABOLIC PANEL
Anion gap: 8 (ref 5–15)
BUN: 25 mg/dL — AB (ref 6–20)
CHLORIDE: 104 mmol/L (ref 98–111)
CO2: 28 mmol/L (ref 22–32)
CREATININE: 1.08 mg/dL (ref 0.61–1.24)
Calcium: 9.7 mg/dL (ref 8.9–10.3)
GFR calc Af Amer: >60 mL/min (ref >60)
GLUCOSE: 110 mg/dL — AB (ref 70–99)
POTASSIUM: 4.9 mmol/L (ref 3.5–5.1)
SODIUM: 140 mmol/L (ref 135–145)

## 2018-09-22 NOTE — Progress Notes (Signed)
   09/22/18 1100  OBSTRUCTIVE SLEEP APNEA  Have you ever been diagnosed with sleep apnea through a sleep study? No  Do you snore loudly (loud enough to be heard through closed doors)?  1  Do you often feel tired, fatigued, or sleepy during the daytime (such as falling asleep during driving or talking to someone)? 1  Has anyone observed you stop breathing during your sleep? 1  Do you have, or are you being treated for high blood pressure? 0  BMI more than 35 kg/m2? 0  Age > 50 (1-yes) 1  Neck circumference greater than:Male 16 inches or larger, Male 17inches or larger? 1  Male Gender (Yes=1) 1  Obstructive Sleep Apnea Score 6  Score 5 or greater  Results sent to PCP

## 2018-09-23 LAB — SURGICAL PCR SCREEN
MRSA, PCR: NEGATIVE
STAPHYLOCOCCUS AUREUS: POSITIVE — AB

## 2018-09-23 NOTE — Progress Notes (Signed)
09-22-18 PCR result routed to Dr. Percell Miller for review.

## 2018-09-28 ENCOUNTER — Other Ambulatory Visit: Payer: Self-pay | Admitting: Orthopedic Surgery

## 2018-09-28 NOTE — Care Plan (Signed)
Spoke with patient. He will discharge to home post surgery with no needs.    [name] Jenkins, [place]

## 2018-09-29 ENCOUNTER — Inpatient Hospital Stay (HOSPITAL_COMMUNITY): Payer: Medicare Other | Admitting: Anesthesiology

## 2018-09-29 ENCOUNTER — Encounter (HOSPITAL_COMMUNITY)
Admission: RE | Disposition: A | Discharge status: Home / Self Care | Payer: Self-pay | Source: Home / Self Care | Attending: Orthopedic Surgery

## 2018-09-29 ENCOUNTER — Inpatient Hospital Stay (HOSPITAL_COMMUNITY)
Admission: RE | Admit: 2018-09-29 | Discharge: 2018-09-30 | DRG: 483 | Disposition: A | Discharge status: Home / Self Care | Payer: Medicare Other | Attending: Orthopedic Surgery | Admitting: Orthopedic Surgery

## 2018-09-29 ENCOUNTER — Encounter (HOSPITAL_COMMUNITY): Payer: Self-pay

## 2018-09-29 ENCOUNTER — Observation Stay (HOSPITAL_COMMUNITY): Payer: Medicare Other

## 2018-09-29 ENCOUNTER — Other Ambulatory Visit: Payer: Self-pay

## 2018-09-29 DIAGNOSIS — M19011 Primary osteoarthritis, right shoulder: Secondary | ICD-10-CM | POA: Diagnosis present

## 2018-09-29 DIAGNOSIS — F3181 Bipolar II disorder: Secondary | ICD-10-CM | POA: Diagnosis present

## 2018-09-29 DIAGNOSIS — Z8249 Family history of ischemic heart disease and other diseases of the circulatory system: Secondary | ICD-10-CM

## 2018-09-29 DIAGNOSIS — K219 Gastro-esophageal reflux disease without esophagitis: Secondary | ICD-10-CM

## 2018-09-29 DIAGNOSIS — Z79899 Other long term (current) drug therapy: Secondary | ICD-10-CM

## 2018-09-29 DIAGNOSIS — Z96619 Presence of unspecified artificial shoulder joint: Secondary | ICD-10-CM

## 2018-09-29 DIAGNOSIS — M19019 Primary osteoarthritis, unspecified shoulder: Secondary | ICD-10-CM | POA: Diagnosis present

## 2018-09-29 DIAGNOSIS — Z8659 Personal history of other mental and behavioral disorders: Secondary | ICD-10-CM

## 2018-09-29 DIAGNOSIS — M19012 Primary osteoarthritis, left shoulder: Secondary | ICD-10-CM | POA: Diagnosis present

## 2018-09-29 DIAGNOSIS — F909 Attention-deficit hyperactivity disorder, unspecified type: Secondary | ICD-10-CM | POA: Diagnosis present

## 2018-09-29 DIAGNOSIS — Z801 Family history of malignant neoplasm of trachea, bronchus and lung: Secondary | ICD-10-CM

## 2018-09-29 DIAGNOSIS — Z825 Family history of asthma and other chronic lower respiratory diseases: Secondary | ICD-10-CM

## 2018-09-29 DIAGNOSIS — Z79891 Long term (current) use of opiate analgesic: Secondary | ICD-10-CM

## 2018-09-29 DIAGNOSIS — G8929 Other chronic pain: Secondary | ICD-10-CM | POA: Diagnosis present

## 2018-09-29 HISTORY — PX: TOTAL SHOULDER ARTHROPLASTY: SHX126

## 2018-09-29 SURGERY — ARTHROPLASTY, SHOULDER, TOTAL
Anesthesia: General | Site: Shoulder | Laterality: Left

## 2018-09-29 MED ORDER — FENTANYL CITRATE (PF) 100 MCG/2ML IJ SOLN
50.0000 ug | INTRAMUSCULAR | Status: DC | PRN
  Administered 2018-09-29: 50 ug via INTRAVENOUS

## 2018-09-29 MED ORDER — LACTATED RINGERS IV SOLN
INTRAVENOUS | Status: DC
  Administered 2018-09-29: 12:00:00 via INTRAVENOUS
  Administered 2018-09-29: 1000 mL via INTRAVENOUS

## 2018-09-29 MED ORDER — GABAPENTIN 300 MG PO CAPS: 300.0000 mg | ORAL_CAPSULE | Freq: Two times a day (BID) | ORAL | 0 refills | Status: AC

## 2018-09-29 MED ORDER — LIDOCAINE 2% (20 MG/ML) 5 ML SYRINGE
INTRAMUSCULAR | Status: DC | PRN
  Administered 2018-09-29: 60 mg via INTRAVENOUS

## 2018-09-29 MED ORDER — CHLORHEXIDINE GLUCONATE 4 % EX LIQD: 60.0000 mL | Freq: Once | CUTANEOUS | Status: DC

## 2018-09-29 MED ORDER — ONDANSETRON HCL 4 MG PO TABS: 4.0000 mg | ORAL_TABLET | Freq: Four times a day (QID) | ORAL | Status: DC | PRN

## 2018-09-29 MED ORDER — FENTANYL CITRATE (PF) 100 MCG/2ML IJ SOLN
INTRAMUSCULAR | Status: AC
  Filled 2018-09-29: qty 2

## 2018-09-29 MED ORDER — METHOCARBAMOL 500 MG PO TABS: 500.0000 mg | ORAL_TABLET | Freq: Three times a day (TID) | ORAL | 0 refills | Status: AC | PRN

## 2018-09-29 MED ORDER — WATER FOR IRRIGATION, STERILE IR SOLN
Status: DC | PRN
  Administered 2018-09-29: 1000 mL

## 2018-09-29 MED ORDER — ATORVASTATIN CALCIUM 20 MG PO TABS: 20.0000 mg | ORAL_TABLET | Freq: Every day | ORAL | Status: DC

## 2018-09-29 MED ORDER — BUPIVACAINE HCL (PF) 0.5 % IJ SOLN
INTRAMUSCULAR | Status: DC | PRN
  Administered 2018-09-29: 15 mL via PERINEURAL

## 2018-09-29 MED ORDER — PHENYLEPHRINE 40 MCG/ML (10ML) SYRINGE FOR IV PUSH (FOR BLOOD PRESSURE SUPPORT)
PREFILLED_SYRINGE | INTRAVENOUS | Status: AC
  Filled 2018-09-29: qty 10

## 2018-09-29 MED ORDER — METHOCARBAMOL 500 MG PO TABS: 500.0000 mg | ORAL_TABLET | Freq: Four times a day (QID) | ORAL | Status: DC | PRN

## 2018-09-29 MED ORDER — EPHEDRINE 5 MG/ML INJ
INTRAVENOUS | Status: AC
  Filled 2018-09-29: qty 10

## 2018-09-29 MED ORDER — MONTELUKAST SODIUM 10 MG PO TABS: 10.0000 mg | ORAL_TABLET | Freq: Every day | ORAL | Status: DC

## 2018-09-29 MED ORDER — OXYCODONE HCL 5 MG PO TABS: 5.0000 mg | ORAL_TABLET | ORAL | 0 refills | Status: AC | PRN

## 2018-09-29 MED ORDER — SODIUM CHLORIDE 0.9 % IR SOLN
Status: DC | PRN
  Administered 2018-09-29: 1000 mL

## 2018-09-29 MED ORDER — OXYCODONE HCL 5 MG PO TABS
10.0000 mg | ORAL_TABLET | Freq: Two times a day (BID) | ORAL | Status: DC
  Administered 2018-09-29 – 2018-09-30 (×2): 10 mg via ORAL
  Filled 2018-09-29 (×2): qty 2

## 2018-09-29 MED ORDER — PHENYLEPHRINE HCL 10 MG/ML IJ SOLN
INTRAMUSCULAR | Status: AC
  Filled 2018-09-29: qty 1

## 2018-09-29 MED ORDER — METOCLOPRAMIDE HCL 5 MG/ML IJ SOLN: 5.0000 mg | Freq: Three times a day (TID) | INTRAMUSCULAR | Status: DC | PRN

## 2018-09-29 MED ORDER — QUETIAPINE FUMARATE 50 MG PO TABS: 200.0000 mg | ORAL_TABLET | ORAL | Status: DC

## 2018-09-29 MED ORDER — PROMETHAZINE HCL 25 MG/ML IJ SOLN: 6.2500 mg | INTRAMUSCULAR | Status: DC | PRN

## 2018-09-29 MED ORDER — MENTHOL 3 MG MT LOZG: 1.0000 | LOZENGE | OROMUCOSAL | Status: DC | PRN

## 2018-09-29 MED ORDER — PHENYLEPHRINE HCL 10 MG/ML IJ SOLN
INTRAMUSCULAR | Status: AC
  Filled 2018-09-29: qty 2

## 2018-09-29 MED ORDER — OXYCODONE HCL 5 MG/5ML PO SOLN: 5.0000 mg | Freq: Once | ORAL | Status: DC | PRN

## 2018-09-29 MED ORDER — DEXAMETHASONE SODIUM PHOSPHATE 10 MG/ML IJ SOLN
INTRAMUSCULAR | Status: DC | PRN
  Administered 2018-09-29: 10 mg via INTRAVENOUS

## 2018-09-29 MED ORDER — OXYCODONE HCL 5 MG PO TABS: 5.0000 mg | ORAL_TABLET | Freq: Once | ORAL | Status: DC | PRN

## 2018-09-29 MED ORDER — ACETAMINOPHEN 500 MG PO TABS
1000.0000 mg | ORAL_TABLET | Freq: Three times a day (TID) | ORAL | Status: DC
  Administered 2018-09-29 – 2018-09-30 (×2): 1000 mg via ORAL
  Filled 2018-09-29 (×2): qty 2

## 2018-09-29 MED ORDER — MIDAZOLAM HCL 2 MG/2ML IJ SOLN
INTRAMUSCULAR | Status: AC
  Filled 2018-09-29: qty 2

## 2018-09-29 MED ORDER — CEFAZOLIN SODIUM-DEXTROSE 1-4 GM/50ML-% IV SOLN
1.0000 g | Freq: Four times a day (QID) | INTRAVENOUS | Status: AC
  Administered 2018-09-29 – 2018-09-30 (×3): 1 g via INTRAVENOUS
  Filled 2018-09-29 (×3): qty 50

## 2018-09-29 MED ORDER — ZOLPIDEM TARTRATE 10 MG PO TABS
10.0000 mg | ORAL_TABLET | Freq: Every day | ORAL | Status: DC
  Administered 2018-09-29: 10 mg via ORAL
  Filled 2018-09-29: qty 1

## 2018-09-29 MED ORDER — HYDROMORPHONE HCL 1 MG/ML IJ SOLN: 0.5000 mg | INTRAMUSCULAR | Status: DC | PRN

## 2018-09-29 MED ORDER — POVIDONE-IODINE 10 % EX SWAB
2.0000 "application " | Freq: Once | CUTANEOUS | Status: AC
  Administered 2018-09-29: 2 via TOPICAL

## 2018-09-29 MED ORDER — ACETAMINOPHEN 500 MG PO TABS: 1000.0000 mg | ORAL_TABLET | Freq: Three times a day (TID) | ORAL | 0 refills | Status: AC

## 2018-09-29 MED ORDER — PHENYLEPHRINE 40 MCG/ML (10ML) SYRINGE FOR IV PUSH (FOR BLOOD PRESSURE SUPPORT)
PREFILLED_SYRINGE | INTRAVENOUS | Status: DC | PRN
  Administered 2018-09-29 (×3): 80 ug via INTRAVENOUS

## 2018-09-29 MED ORDER — PHENOL 1.4 % MT LIQD
1.0000 | OROMUCOSAL | Status: DC | PRN
  Filled 2018-09-29: qty 177

## 2018-09-29 MED ORDER — DIPHENHYDRAMINE HCL 12.5 MG/5ML PO ELIX: 12.5000 mg | ORAL_SOLUTION | ORAL | Status: DC | PRN

## 2018-09-29 MED ORDER — DOCUSATE SODIUM 100 MG PO CAPS: 100.0000 mg | ORAL_CAPSULE | Freq: Two times a day (BID) | ORAL | 0 refills | Status: AC

## 2018-09-29 MED ORDER — MIDAZOLAM HCL 2 MG/2ML IJ SOLN: 1.0000 mg | INTRAMUSCULAR | Status: DC | PRN

## 2018-09-29 MED ORDER — FLUOXETINE HCL 20 MG PO CAPS
80.0000 mg | ORAL_CAPSULE | Freq: Every day | ORAL | Status: DC
  Administered 2018-09-30: 80 mg via ORAL
  Filled 2018-09-29: qty 4

## 2018-09-29 MED ORDER — ONDANSETRON HCL 4 MG/2ML IJ SOLN
INTRAMUSCULAR | Status: AC
  Filled 2018-09-29: qty 2

## 2018-09-29 MED ORDER — ACETAMINOPHEN 325 MG PO TABS: 325.0000 mg | ORAL_TABLET | Freq: Four times a day (QID) | ORAL | Status: DC | PRN

## 2018-09-29 MED ORDER — SUGAMMADEX SODIUM 200 MG/2ML IV SOLN
INTRAVENOUS | Status: DC | PRN
  Administered 2018-09-29: 450 mg via INTRAVENOUS

## 2018-09-29 MED ORDER — TRANEXAMIC ACID-NACL 1000-0.7 MG/100ML-% IV SOLN
1000.0000 mg | INTRAVENOUS | Status: AC
  Administered 2018-09-29: 1000 mg via INTRAVENOUS
  Filled 2018-09-29: qty 100

## 2018-09-29 MED ORDER — ONDANSETRON HCL 4 MG PO TABS: 4.0000 mg | ORAL_TABLET | Freq: Three times a day (TID) | ORAL | 0 refills | Status: AC | PRN

## 2018-09-29 MED ORDER — METOCLOPRAMIDE HCL 5 MG PO TABS: 5.0000 mg | ORAL_TABLET | Freq: Three times a day (TID) | ORAL | Status: DC | PRN

## 2018-09-29 MED ORDER — PHENYLEPHRINE 40 MCG/ML (10ML) SYRINGE FOR IV PUSH (FOR BLOOD PRESSURE SUPPORT)
PREFILLED_SYRINGE | INTRAVENOUS | Status: AC
  Filled 2018-09-29: qty 20

## 2018-09-29 MED ORDER — LISDEXAMFETAMINE DIMESYLATE 70 MG PO CAPS
70.0000 mg | ORAL_CAPSULE | Freq: Every day | ORAL | Status: DC
  Administered 2018-09-30: 70 mg via ORAL
  Filled 2018-09-29: qty 1

## 2018-09-29 MED ORDER — GABAPENTIN 300 MG PO CAPS
300.0000 mg | ORAL_CAPSULE | Freq: Three times a day (TID) | ORAL | Status: DC
  Administered 2018-09-29 – 2018-09-30 (×3): 300 mg via ORAL
  Filled 2018-09-29 (×3): qty 1

## 2018-09-29 MED ORDER — OXYCODONE HCL 5 MG PO TABS: 5.0000 mg | ORAL_TABLET | ORAL | Status: DC | PRN

## 2018-09-29 MED ORDER — FERROUS SULFATE 325 (65 FE) MG PO TABS
325.0000 mg | ORAL_TABLET | Freq: Every day | ORAL | Status: DC
  Administered 2018-09-30: 325 mg via ORAL
  Filled 2018-09-29: qty 1

## 2018-09-29 MED ORDER — OXYCODONE HCL ER 20 MG PO T12A
20.0000 mg | EXTENDED_RELEASE_TABLET | Freq: Two times a day (BID) | ORAL | Status: DC
  Administered 2018-09-29: 20 mg via ORAL
  Filled 2018-09-29: qty 1

## 2018-09-29 MED ORDER — ONDANSETRON HCL 4 MG/2ML IJ SOLN: 4.0000 mg | Freq: Four times a day (QID) | INTRAMUSCULAR | Status: DC | PRN

## 2018-09-29 MED ORDER — DEXAMETHASONE SODIUM PHOSPHATE 10 MG/ML IJ SOLN
INTRAMUSCULAR | Status: AC
  Filled 2018-09-29: qty 1

## 2018-09-29 MED ORDER — FENTANYL CITRATE (PF) 100 MCG/2ML IJ SOLN: 25.0000 ug | INTRAMUSCULAR | Status: DC | PRN

## 2018-09-29 MED ORDER — KETOROLAC TROMETHAMINE 15 MG/ML IJ SOLN
15.0000 mg | Freq: Four times a day (QID) | INTRAMUSCULAR | Status: DC
  Administered 2018-09-30 (×2): 15 mg via INTRAVENOUS
  Filled 2018-09-29 (×3): qty 1

## 2018-09-29 MED ORDER — METHOCARBAMOL 500 MG IVPB - SIMPLE MED
500.0000 mg | Freq: Four times a day (QID) | INTRAVENOUS | Status: DC | PRN
  Filled 2018-09-29: qty 50

## 2018-09-29 MED ORDER — PROPOFOL 10 MG/ML IV BOLUS
INTRAVENOUS | Status: DC | PRN
  Administered 2018-09-29: 150 mg via INTRAVENOUS

## 2018-09-29 MED ORDER — ALPRAZOLAM 1 MG PO TABS
2.0000 mg | ORAL_TABLET | Freq: Two times a day (BID) | ORAL | Status: DC
  Administered 2018-09-30: 2 mg via ORAL
  Filled 2018-09-29: qty 2

## 2018-09-29 MED ORDER — IBUPROFEN 800 MG PO TABS: 800.0000 mg | ORAL_TABLET | Freq: Three times a day (TID) | ORAL | 2 refills | Status: AC | PRN

## 2018-09-29 MED ORDER — ACETAMINOPHEN 500 MG PO TABS
1000.0000 mg | ORAL_TABLET | Freq: Once | ORAL | Status: AC
  Administered 2018-09-29: 1000 mg via ORAL
  Filled 2018-09-29: qty 2

## 2018-09-29 MED ORDER — SODIUM CHLORIDE 0.9 % IV SOLN
INTRAVENOUS | Status: DC | PRN
  Administered 2018-09-29: 50 ug/min via INTRAVENOUS

## 2018-09-29 MED ORDER — ROCURONIUM BROMIDE 10 MG/ML (PF) SYRINGE
PREFILLED_SYRINGE | INTRAVENOUS | Status: DC | PRN
  Administered 2018-09-29 (×2): 20 mg via INTRAVENOUS
  Administered 2018-09-29: 60 mg via INTRAVENOUS
  Administered 2018-09-29: 20 mg via INTRAVENOUS

## 2018-09-29 MED ORDER — GABAPENTIN 300 MG PO CAPS
300.0000 mg | ORAL_CAPSULE | Freq: Once | ORAL | Status: AC
  Administered 2018-09-29: 300 mg via ORAL
  Filled 2018-09-29: qty 1

## 2018-09-29 MED ORDER — ROCURONIUM BROMIDE 10 MG/ML (PF) SYRINGE
PREFILLED_SYRINGE | INTRAVENOUS | Status: AC
  Filled 2018-09-29: qty 10

## 2018-09-29 MED ORDER — QUETIAPINE FUMARATE 50 MG PO TABS
200.0000 mg | ORAL_TABLET | Freq: Every day | ORAL | Status: DC
  Filled 2018-09-29: qty 4

## 2018-09-29 MED ORDER — MEPERIDINE HCL 50 MG/ML IJ SOLN: 6.2500 mg | INTRAMUSCULAR | Status: DC | PRN

## 2018-09-29 MED ORDER — LACTATED RINGERS IV SOLN
INTRAVENOUS | Status: DC
  Administered 2018-09-29 – 2018-09-30 (×2): via INTRAVENOUS

## 2018-09-29 MED ORDER — ONDANSETRON HCL 4 MG/2ML IJ SOLN
INTRAMUSCULAR | Status: DC | PRN
  Administered 2018-09-29: 4 mg via INTRAVENOUS

## 2018-09-29 MED ORDER — QUETIAPINE FUMARATE 100 MG PO TABS
500.0000 mg | ORAL_TABLET | Freq: Every day | ORAL | Status: DC
  Administered 2018-09-29: 500 mg via ORAL
  Filled 2018-09-29: qty 5
  Filled 2018-09-29: qty 10

## 2018-09-29 MED ORDER — DIVALPROEX SODIUM ER 500 MG PO TB24
1000.0000 mg | ORAL_TABLET | Freq: Every day | ORAL | Status: DC
  Administered 2018-09-29: 1000 mg via ORAL
  Filled 2018-09-29: qty 2

## 2018-09-29 MED ORDER — PROPOFOL 10 MG/ML IV BOLUS
INTRAVENOUS | Status: AC
  Filled 2018-09-29: qty 20

## 2018-09-29 MED ORDER — LIDOCAINE 2% (20 MG/ML) 5 ML SYRINGE
INTRAMUSCULAR | Status: AC
  Filled 2018-09-29: qty 5

## 2018-09-29 MED ORDER — CEFAZOLIN SODIUM-DEXTROSE 2-4 GM/100ML-% IV SOLN
2.0000 g | INTRAVENOUS | Status: AC
  Administered 2018-09-29: 2 g via INTRAVENOUS
  Filled 2018-09-29: qty 100

## 2018-09-29 MED ORDER — DOCUSATE SODIUM 100 MG PO CAPS
100.0000 mg | ORAL_CAPSULE | Freq: Two times a day (BID) | ORAL | Status: DC
  Administered 2018-09-29 – 2018-09-30 (×2): 100 mg via ORAL
  Filled 2018-09-29 (×2): qty 1

## 2018-09-29 MED ORDER — POLYETHYLENE GLYCOL 3350 17 G PO PACK: 17.0000 g | PACK | Freq: Every day | ORAL | Status: DC | PRN

## 2018-09-29 MED ORDER — SUGAMMADEX SODIUM 500 MG/5ML IV SOLN
INTRAVENOUS | Status: AC
  Filled 2018-09-29: qty 5

## 2018-09-29 MED ORDER — BUPIVACAINE LIPOSOME 1.3 % IJ SUSP
INTRAMUSCULAR | Status: DC | PRN
  Administered 2018-09-29: 10 mL via PERINEURAL

## 2018-09-29 MED ORDER — OMEPRAZOLE 20 MG PO CPDR: 20.0000 mg | DELAYED_RELEASE_CAPSULE | Freq: Every day | ORAL | 0 refills | Status: AC

## 2018-09-29 SURGICAL SUPPLY — 63 items
BIT DRILL 2.0X128 (BIT) ×2 IMPLANT
BIT DRILL 2.0X128MM (BIT) ×1
BLADE SAW SAG 29X58X.64 (BLADE) IMPLANT
BLADE SAW SAG 73X25 THK (BLADE) ×2
BLADE SAW SGTL 73X25 THK (BLADE) ×1 IMPLANT
CEMENT BONE R 1X40 (Cement) ×6 IMPLANT
CHLORAPREP W/TINT 26ML (MISCELLANEOUS) ×3 IMPLANT
CLOSURE WOUND 1/2 X4 (GAUZE/BANDAGES/DRESSINGS) ×1
COVER SURGICAL LIGHT HANDLE (MISCELLANEOUS) ×3 IMPLANT
COVER WAND RF STERILE (DRAPES) ×3 IMPLANT
DRAPE INCISE IOBAN 66X45 STRL (DRAPES) ×3 IMPLANT
DRAPE U-SHAPE 47X51 STRL (DRAPES) ×3 IMPLANT
DRSG ADAPTIC 3X8 NADH LF (GAUZE/BANDAGES/DRESSINGS) ×3 IMPLANT
DRSG MEPILEX BORDER 4X8 (GAUZE/BANDAGES/DRESSINGS) ×3 IMPLANT
DRSG PAD ABDOMINAL 8X10 ST (GAUZE/BANDAGES/DRESSINGS) ×6 IMPLANT
ELECT REM PT RETURN 9FT ADLT (ELECTROSURGICAL) ×3
ELECTRODE REM PT RTRN 9FT ADLT (ELECTROSURGICAL) ×1 IMPLANT
GAUZE SPONGE 4X4 12PLY STRL (GAUZE/BANDAGES/DRESSINGS) ×3 IMPLANT
GLENOID PEGGED STRL 4MM LRG (Peg) ×6 IMPLANT
GLENOID POST REGENREX HYBRID (Orthopedic Implant) ×3 IMPLANT
GLOVE BIO SURGEON STRL SZ7.5 (GLOVE) ×15 IMPLANT
GLOVE BIOGEL PI IND STRL 7.0 (GLOVE) ×2 IMPLANT
GLOVE BIOGEL PI IND STRL 8 (GLOVE) ×2 IMPLANT
GLOVE BIOGEL PI INDICATOR 7.0 (GLOVE) ×4
GLOVE BIOGEL PI INDICATOR 8 (GLOVE) ×4
GOWN STRL REUS W/ TWL LRG LVL3 (GOWN DISPOSABLE) ×2 IMPLANT
GOWN STRL REUS W/ TWL XL LVL3 (GOWN DISPOSABLE) ×1 IMPLANT
GOWN STRL REUS W/TWL LRG LVL3 (GOWN DISPOSABLE) ×4
GOWN STRL REUS W/TWL XL LVL3 (GOWN DISPOSABLE) ×2
HEAD HUMERAL BIPOLAR 54X24X58M (Stem) ×3 IMPLANT
HEAD HUMERAL COMP STD (Orthopedic Implant) ×1 IMPLANT
HUMERAL HEAD COMP STD (Orthopedic Implant) ×3 IMPLANT
KIT BASIN OR (CUSTOM PROCEDURE TRAY) ×3 IMPLANT
KIT TURNOVER KIT A (KITS) ×3 IMPLANT
MANIFOLD NEPTUNE II (INSTRUMENTS) ×3 IMPLANT
NEEDLE HYPO 21X1.5 SAFETY (NEEDLE) ×3 IMPLANT
NEEDLE MAYO .5 CIRCLE (NEEDLE) ×3 IMPLANT
NS IRRIG 1000ML POUR BTL (IV SOLUTION) ×3 IMPLANT
PACK SHOULDER (CUSTOM PROCEDURE TRAY) ×3 IMPLANT
PAD ARMBOARD 7.5X6 YLW CONV (MISCELLANEOUS) ×6 IMPLANT
PIN HUMERAL STMN 3.2MMX9IN (INSTRUMENTS) ×3 IMPLANT
PIN STEINMANN THREADED TIP (PIN) ×3 IMPLANT
SLING ARM IMMOBILIZER LRG (SOFTGOODS) ×3 IMPLANT
SLING ARM IMMOBILIZER MED (SOFTGOODS) IMPLANT
SPONGE LAP 18X18 X RAY DECT (DISPOSABLE) ×3 IMPLANT
STEM HUMERAL STRL 10MMX55MM (Stem) ×3 IMPLANT
STRIP CLOSURE SKIN 1/2X4 (GAUZE/BANDAGES/DRESSINGS) ×2 IMPLANT
SUCTION FRAZIER HANDLE 10FR (MISCELLANEOUS) ×2
SUCTION TUBE FRAZIER 10FR DISP (MISCELLANEOUS) ×1 IMPLANT
SUPPORT WRAP ARM LG (MISCELLANEOUS) IMPLANT
SUT FIBERWIRE #2 38 T-5 BLUE (SUTURE) ×9
SUT MNCRL AB 4-0 PS2 18 (SUTURE) ×3 IMPLANT
SUT VIC AB 0 CT1 27 (SUTURE) ×2
SUT VIC AB 0 CT1 27XBRD ANBCTR (SUTURE) ×1 IMPLANT
SUT VIC AB 2-0 CT1 27 (SUTURE) ×2
SUT VIC AB 2-0 CT1 TAPERPNT 27 (SUTURE) ×1 IMPLANT
SUTURE FIBERWR #2 38 T-5 BLUE (SUTURE) ×3 IMPLANT
SYRINGE IRR TOOMEY STRL 70CC (SYRINGE) ×3 IMPLANT
TOWEL OR 17X26 10 PK STRL BLUE (TOWEL DISPOSABLE) ×6 IMPLANT
TOWER CARTRIDGE SMART MIX (DISPOSABLE) ×3 IMPLANT
TRAY FOLEY W/BAG SLVR 14FR (SET/KITS/TRAYS/PACK) IMPLANT
WATER STERILE IRR 1000ML POUR (IV SOLUTION) ×3 IMPLANT
YANKAUER SUCT BULB TIP NO VENT (SUCTIONS) ×3 IMPLANT

## 2018-09-29 NOTE — Progress Notes (Signed)
Assisted Dr. Germeroth with left, ultrasound guided, interscalene  block. Side rails up, monitors on throughout procedure. See vital signs in flow sheet. Tolerated Procedure well. 

## 2018-09-29 NOTE — Discharge Instructions (Signed)
Keep sling on at all times.  Do not bear weight with arm. Keep your dressing on and dry until follow up. Take pain medicine as needed with the goal of transitioning to over the counter medicines.  If needed, you may increase breakthrough pain medication for the first few days post up to 2 tablets every 4 hours.  Stop as needed pain medication as soon as you are able.  INSTRUCTIONS AFTER JOINT REPLACEMENT   o Remove items at home which could result in a fall. This includes throw rugs or furniture in walking pathways o ICE to the affected joint every three hours while awake for 30 minutes at a time, for at least the first 3-5 days, and then as needed for pain and swelling.  Continue to use ice for pain and swelling. You may notice swelling that will progress down to the foot and ankle.  This is normal after surgery.  Elevate your leg when you are not up walking on it.   o Continue to use the breathing machine you got in the hospital (incentive spirometer) which will help keep your temperature down.  It is common for your temperature to cycle up and down following surgery, especially at night when you are not up moving around and exerting yourself.  The breathing machine keeps your lungs expanded and your temperature down.   DIET:  As you were doing prior to hospitalization, we recommend a well-balanced diet.  DRESSING / WOUND CARE / SHOWERING  Keep dressing dry.  You may use an occlusive plastic wrap (Press'n Seal for example) with blue painter's tape at edges, NO SOAKING/SUBMERGING IN THE BATHTUB.  If the bandage gets wet, change with a clean dry gauze.  If the incision gets wet, pat the wound dry with a clean towel.  ACTIVITY  o Increase activity slowly as tolerated, but follow the weight bearing instructions below.   o No driving for 6 weeks or until further direction given by your physician.  You cannot drive while taking narcotics.  o No lifting or carrying greater than 10 lbs. until further  directed by your surgeon. o Avoid periods of inactivity such as sitting longer than an hour when not asleep. This helps prevent blood clots.  o You may return to work once you are authorized by your doctor.     WEIGHT BEARING  Do not bear weight with arm.  Maintain sling at all times.  A rehabilitation program following joint replacement surgery can speed recovery and prevent re-injury in the future due to weakened muscles. Contact your doctor or a physical therapist for more information on knee rehabilitation.    CONSTIPATION  Constipation is defined medically as fewer than three stools per week and severe constipation as less than one stool per week.  Even if you have a regular bowel pattern at home, your normal regimen is likely to be disrupted due to multiple reasons following surgery.  Combination of anesthesia, postoperative narcotics, change in appetite and fluid intake all can affect your bowels.   YOU MUST use at least one of the following options; they are listed in order of increasing strength to get the job done.  They are all available over the counter, and you may need to use some, POSSIBLY even all of these options:    Drink plenty of fluids (prune juice may be helpful) and high fiber foods Colace 100 mg by mouth twice a day  Senokot for constipation as directed and as needed Dulcolax (bisacodyl),  take with full glass of water  Miralax (polyethylene glycol) once or twice a day as needed.  If you have tried all these things and are unable to have a bowel movement in the first 3-4 days after surgery call either your surgeon or your primary doctor.    If you experience loose stools or diarrhea, hold the medications until you stool forms back up.  If your symptoms do not get better within 1 week or if they get worse, check with your doctor.  If you experience "the worst abdominal pain ever" or develop nausea or vomiting, please contact the office immediately for further  recommendations for treatment.   ITCHING:  If you experience itching with your medications, try taking only a single pain pill, or even half a pain pill at a time.  You can also use Benadryl over the counter for itching or also to help with sleep.   TED HOSE STOCKINGS:  Use stockings on both legs until for at least 2 weeks or as directed by physician office. They may be removed at night for sleeping.  MEDICATIONS:  See your medication summary on the After Visit Summary that nursing will review with you.  You may have some home medications which will be placed on hold until you complete the course of blood thinner medication.  It is important for you to complete the blood thinner medication as prescribed.  PRECAUTIONS:  If you experience chest pain or shortness of breath - call 911 immediately for transfer to the hospital emergency department.   If you develop a fever greater that 101 F, purulent drainage from wound, increased redness or drainage from wound, foul odor from the wound/dressing, or calf pain - CONTACT YOUR SURGEON.                                                   FOLLOW-UP APPOINTMENTS:  If you do not already have a post-op appointment, please call the office for an appointment to be seen by your surgeon.  Guidelines for how soon to be seen are listed in your After Visit Summary, but are typically between 1-4 weeks after surgery.  OTHER INSTRUCTIONS:     MAKE SURE YOU:   Understand these instructions.   Get help right away if you are not doing well or get worse.    Thank you for letting us be a part of your medical care team.  It is a privilege we respect greatly.  We hope these instructions will help you stay on track for a fast and full recovery!

## 2018-09-29 NOTE — Anesthesia Preprocedure Evaluation (Signed)
Anesthesia Evaluation  Patient identified by MRN, date of birth, ID band Patient awake    Reviewed: Allergy & Precautions, NPO status , Patient's Chart, lab work & pertinent test results  Airway Mallampati: II  TM Distance: >3 FB Neck ROM: Full    Dental no notable dental hx.    Pulmonary neg pulmonary ROS,    Pulmonary exam normal breath sounds clear to auscultation       Cardiovascular negative cardio ROS Normal cardiovascular exam Rhythm:Regular Rate:Normal     Neuro/Psych PSYCHIATRIC DISORDERS Anxiety Depression Bipolar Disorder negative neurological ROS     GI/Hepatic Neg liver ROS, GERD  ,  Endo/Other  negative endocrine ROS  Renal/GU negative Renal ROS     Musculoskeletal  (+) Arthritis ,   Abdominal   Peds  Hematology negative hematology ROS (+)   Anesthesia Other Findings   Reproductive/Obstetrics negative OB ROS                             Anesthesia Physical Anesthesia Plan  ASA: II  Anesthesia Plan: General   Post-op Pain Management: GA combined w/ Regional for post-op pain   Induction: Intravenous  PONV Risk Score and Plan: 2 and Ondansetron, Dexamethasone and Midazolam  Airway Management Planned: Oral ETT  Additional Equipment:   Intra-op Plan:   Post-operative Plan: Extubation in OR  Informed Consent: I have reviewed the patients History and Physical, chart, labs and discussed the procedure including the risks, benefits and alternatives for the proposed anesthesia with the patient or authorized representative who has indicated his/her understanding and acceptance.   Dental advisory given  Plan Discussed with: CRNA  Anesthesia Plan Comments:         Anesthesia Quick Evaluation

## 2018-09-29 NOTE — Anesthesia Postprocedure Evaluation (Signed)
Anesthesia Post Note  Patient: Advice worker  Procedure(s) Performed: LEFT TOTAL SHOULDER ARTHROPLASTY (Left Shoulder)     Patient location during evaluation: PACU Anesthesia Type: General Level of consciousness: awake and alert Pain management: pain level controlled Vital Signs Assessment: post-procedure vital signs reviewed and stable Respiratory status: spontaneous breathing, nonlabored ventilation and respiratory function stable Cardiovascular status: blood pressure returned to baseline and stable Postop Assessment: no apparent nausea or vomiting Anesthetic complications: no    Last Vitals:  Vitals:   09/29/18 1515 09/29/18 1536  BP: 130/88 (!) 139/95  Pulse: (!) 107 (!) 106  Resp: 18 18  Temp: 37.1 C 36.7 C  SpO2: 97% 97%    Last Pain:  Vitals:   09/29/18 1536  TempSrc: Oral  PainSc:                  Audry Pili

## 2018-09-29 NOTE — Transfer of Care (Signed)
Immediate Anesthesia Transfer of Care Note  Patient: [name] Jenkins  Procedure(s) Performed: LEFT TOTAL SHOULDER ARTHROPLASTY (Left Shoulder)  Patient Location: PACU  Anesthesia Type:General  Level of Consciousness: awake, alert , drowsy and patient cooperative  Airway & Oxygen Therapy: Patient Spontanous Breathing and Patient connected to face mask oxygen  Post-op Assessment: Report given to RN, Post -op Vital signs reviewed and stable and Patient moving all extremities  Post vital signs: Reviewed and stable  Last Vitals:  Vitals Value Taken Time  BP 136/90 09/29/2018  2:30 PM  Temp    Pulse 104 09/29/2018  2:31 PM  Resp 13 09/29/2018  2:31 PM  SpO2 98 % 09/29/2018  2:31 PM  Vitals shown include unvalidated device data.  Last Pain:  Vitals:   09/29/18 0945  TempSrc:   PainSc: 0-No pain      Patients Stated Pain Goal: 5 (63/33/54 [id number])  Complications: No apparent anesthesia complications

## 2018-09-29 NOTE — Anesthesia Procedure Notes (Signed)
Anesthesia Regional Block: Interscalene brachial plexus block   Pre-Anesthetic Checklist: ,, timeout performed, Correct Patient, Correct Site, Correct Laterality, Correct Procedure, Correct Position, site marked, Risks and benefits discussed,  Surgical consent,  Pre-op evaluation,  At surgeon's request and post-op pain management  Laterality: Left  Prep: chloraprep       Needles:  Injection technique: Single-shot  Needle Type: Stimulator Needle - 40     Needle Length: 4cm  Needle Gauge: 22     Additional Needles:   Procedures:,,,, ultrasound used (permanent image in chart),,,,  Narrative:  Start time: 09/29/2018 9:35 AM End time: 09/29/2018 9:38 AM Injection made incrementally with aspirations every 5 mL.  Performed by: Personally  Anesthesiologist: Nolon Nations, MD  Additional Notes: BP cuff, EKG monitors applied. Sedation begun. Nerve location verified with U/S. Anesthetic injected incrementally, slowly , and after neg aspirations under direct u/s guidance. Good perineural spread. Tolerated well.

## 2018-09-29 NOTE — Anesthesia Procedure Notes (Signed)
Procedure Name: Intubation Date/Time: 09/29/2018 10:27 AM Performed by: Mitzie Na, CRNA Pre-anesthesia Checklist: Patient identified, Emergency Drugs available, Suction available, Patient being monitored and Timeout performed Patient Re-evaluated:Patient Re-evaluated prior to induction Oxygen Delivery Method: Circle system utilized Preoxygenation: Pre-oxygenation with 100% oxygen Induction Type: IV induction Ventilation: Oral airway inserted - appropriate to patient size and Mask ventilation without difficulty Laryngoscope Size: Mac and 4 Grade View: Grade II Tube type: Oral Tube size: 7.5 mm Number of attempts: 1 Airway Equipment and Method: Stylet Placement Confirmation: ETT inserted through vocal cords under direct vision,  positive ETCO2 and breath sounds checked- equal and bilateral Secured at: 24 cm Tube secured with: Tape Dental Injury: Teeth and Oropharynx as per pre-operative assessment

## 2018-09-29 NOTE — Care Plan (Signed)
Spoke with patient. He will discharge to home post surgery with no needs.   All after care will be arranged in the office.   Please contact Ladell Heads, [place] with questions   Thank you

## 2018-09-29 NOTE — Op Note (Signed)
09/29/2018  4:38 PM  PATIENT:  [name] Jenkins    PRE-OPERATIVE DIAGNOSIS:  OA LEFT SHOULDER  POST-OPERATIVE DIAGNOSIS:  Same  PROCEDURE:  LEFT TOTAL SHOULDER ARTHROPLASTY  SURGEON:  Renette Butters, MD  PHYSICIAN ASSISTANT: Roxan Hockey, PA-C, he was present and scrubbed throughout the case, critical for completion in a timely fashion, and for retraction, instrumentation, and closure.   ANESTHESIA:   General  PREOPERATIVE INDICATIONS:  [name] Jenkins is a  52 y.o. male with a diagnosis of OA LEFT SHOULDER who failed conservative measures and elected for surgical management.    The risks benefits and alternatives were discussed with the patient preoperatively including but not limited to the risks of infection, bleeding, nerve injury, cardiopulmonary complications, the need for revision surgery, dislocation, loosening, incomplete relief of pain, among others, and the patient was willing to proceed.   OPERATIVE IMPLANTS: biomet size 10 micro press-fit humeral stem, size 54x24  humeral head, with a large cemented glenoid polyethylene 3 peg implant.   OPERATIVE FINDINGS: Advanced glenohumeral osteoarthritis involving the glenoid and the humeral head with substantial osteophyte formation inferiorly.   OPERATIVE PROCEDURE: The patient was brought to the operating room and placed in the supine position. General anesthesia was administered. IV antibiotics were given.  The upper extremity was prepped and draped in usual sterile fashion. The patient was in a beachchair position with all bony prominences padded.   Time out was performed and a deltopectoral approach was carried out. The biceps tendon was tenodesed to the pectoralis tendon. The subscapularis was released, tagging it with a #38fiberwire, leaving a cuff of tendon for repair.   The inferior osteophyte was removed, and release of the capsule off of the humeral side was completed. The head was dislocated, and I reamed sequentially. I  placed the humeral cutting guide at 30 of retroversion, and then pinned this into place, and made my humeral neck cut. This was at the appropriate level.   I then placed deep retractors and exposed the glenoid. I excised the labrum circumferentially, taking care to protect the axillary nerve inferiorly.   I then placed a guidewire into the center position, controlling appropriate version and inclination. I then reamed over the guidewire with the small reamer, and was satisfied with the preparation. I preserved the subchondral bone in order to maximize the strength and minimize the risk for subsequent subsidence.   I then drilled the central hole for the regenerex peg, and then placed the guide, and then drilled the 3 peripheral peg holes. I had excellent bony circumferential contact.   I then cleaned the glenoid, irrigated it copiously, and then dried it and cemented the prosthesis into place. Excellent seating was achieved. I had full exposure. The cement cured, and then I turned my attention to the humeral side.   I sequentially broached, up to the selected size, with the broach set at 30 of retroversion. I then placed the real stem. I trialed with multiple heads, and the above-named component was selected. Increased posterior coverage improved the coverage. The soft tissue tension was appropriate.   I then impacted the real humeral head into place, reduced the head, and irrigated copiously. Excellent stability and range of motion was achieved. I repaired the subscapularis with 4 #2 fiberwire, as well as the rotator interval, and irrigated copiously once more. The subcutaneous tissue was closed with Vicryl including the deltopectoral fascia.   The skin was closed with Steri-Strips and sterile gauze was applied. He had a preoperative nerve  block. He tolerated the procedure well and there were no complications.    POSt OP plan: mobilize for dvt px. Sling full time

## 2018-09-29 NOTE — Addendum Note (Signed)
Addendum  created 09/29/18 1606 by Mitzie Na, CRNA   Intraprocedure Meds edited

## 2018-09-29 NOTE — Interval H&P Note (Signed)
I participated in the care of this patient and agree with the above history, physical and evaluation. I performed a review of the history and a physical exam as detailed   Lynett Brasil Daniel Agron Swiney MD  

## 2018-09-30 DIAGNOSIS — M19012 Primary osteoarthritis, left shoulder: Secondary | ICD-10-CM | POA: Diagnosis present

## 2018-09-30 DIAGNOSIS — Z79899 Other long term (current) drug therapy: Secondary | ICD-10-CM | POA: Diagnosis not present

## 2018-09-30 DIAGNOSIS — M19011 Primary osteoarthritis, right shoulder: Secondary | ICD-10-CM | POA: Diagnosis present

## 2018-09-30 DIAGNOSIS — Z8249 Family history of ischemic heart disease and other diseases of the circulatory system: Secondary | ICD-10-CM | POA: Diagnosis not present

## 2018-09-30 DIAGNOSIS — F3181 Bipolar II disorder: Secondary | ICD-10-CM | POA: Diagnosis present

## 2018-09-30 DIAGNOSIS — F909 Attention-deficit hyperactivity disorder, unspecified type: Secondary | ICD-10-CM | POA: Diagnosis present

## 2018-09-30 DIAGNOSIS — Z801 Family history of malignant neoplasm of trachea, bronchus and lung: Secondary | ICD-10-CM | POA: Diagnosis not present

## 2018-09-30 DIAGNOSIS — G8929 Other chronic pain: Secondary | ICD-10-CM | POA: Diagnosis present

## 2018-09-30 DIAGNOSIS — Z825 Family history of asthma and other chronic lower respiratory diseases: Secondary | ICD-10-CM | POA: Diagnosis not present

## 2018-09-30 DIAGNOSIS — Z79891 Long term (current) use of opiate analgesic: Secondary | ICD-10-CM | POA: Diagnosis not present

## 2018-09-30 NOTE — Discharge Summary (Signed)
Discharge Summary  Patient ID: [name] Jenkins MRN: [id number] DOB/AGE: 12-30-1965 52 y.o.  Admit date: 09/29/2018 Discharge date: 09/30/2018  Admission Diagnoses:  Primary osteoarthritis, left shoulder  Discharge Diagnoses:  Principal Problem:   Primary osteoarthritis, left shoulder Active Problems:   History of depression   Chronic pain   Primary osteoarthritis, right shoulder   Attention deficit hyperactivity disorder (ADHD)   Bipolar II disorder (HCC)   Primary osteoarthritis of shoulder   Past Medical History:  Diagnosis Date  . Anxiety   . Arthritis   . Depression   . GERD (gastroesophageal reflux disease)     Surgeries: Procedure(s): LEFT TOTAL SHOULDER ARTHROPLASTY on 09/29/2018   Consultants (if any):   Discharged Condition: Improved  Hospital Course: Cillian Gwinner is an 52 y.o. male who was admitted 09/29/2018 with a diagnosis of Primary osteoarthritis, left shoulder and went to the operating room on 09/29/2018 and underwent the above named procedures.    He was given perioperative antibiotics:  Anti-infectives (From admission, onward)   Start     Dose/Rate Route Frequency Ordered Stop   09/29/18 1700  ceFAZolin (ANCEF) IVPB 1 g/50 mL premix     1 g 100 mL/hr over 30 Minutes Intravenous Every 6 hours 09/29/18 1532 09/30/18 0546   09/29/18 0745  ceFAZolin (ANCEF) IVPB 2g/100 mL premix     2 g 200 mL/hr over 30 Minutes Intravenous On call to O.R. 09/29/18 [id number] 09/29/18 1029    .  He was given sequential compression devices and early ambulation for DVT prophylaxis.  He benefited maximally from the hospital stay and there were no complications.    Recent vital signs:  Vitals:   09/30/18 0142 09/30/18 0533  BP: 129/86 124/74  Pulse: 92 87  Resp: 14 20  Temp: 98.1 F (36.7 C) 98.6 F (37 C)  SpO2: 94% 96%    Recent laboratory studies:  Lab Results  Component Value Date   HGB 15.0 09/22/2018   HGB 15.9 04/09/2017   HGB 16.3 02/13/2017   Lab Results   Component Value Date   WBC 6.4 09/22/2018   PLT 206 09/22/2018   Lab Results  Component Value Date   INR 1.1 (H) 04/09/2017   Lab Results  Component Value Date   NA 140 09/22/2018   K 4.9 09/22/2018   CL 104 09/22/2018   CO2 28 09/22/2018   BUN 25 (H) 09/22/2018   CREATININE 1.08 09/22/2018   GLUCOSE 110 (H) 09/22/2018    Discharge Medications:   Allergies as of 09/30/2018   No Known Allergies     Medication List    TAKE these medications   acetaminophen 500 MG tablet Commonly known as:  TYLENOL Take 2 tablets (1,000 mg total) by mouth every 8 (eight) hours for 14 days. For Pain.   alprazolam 2 MG tablet Commonly known as:  XANAX Take 2 mg by mouth 2 (two) times daily.   atorvastatin 20 MG tablet Commonly known as:  LIPITOR Take 20 mg by mouth at bedtime.   divalproex 500 MG 24 hr tablet Commonly known as:  DEPAKOTE ER Take 1,000 mg by mouth at bedtime.   docusate sodium 100 MG capsule Commonly known as:  COLACE Take 1 capsule (100 mg total) by mouth 2 (two) times daily. To prevent constipation while taking pain medication.   ferrous sulfate 325 (65 FE) MG tablet Take 325 mg by mouth daily with breakfast.   FLUoxetine 40 MG capsule Commonly known as:  PROZAC Take 80 mg  by mouth daily.   gabapentin 300 MG capsule Commonly known as:  NEURONTIN Take 1 capsule (300 mg total) by mouth 2 (two) times daily for 14 days. For 2 weeks post op for pain.   ibuprofen 800 MG tablet Commonly known as:  ADVIL,MOTRIN Take 1 tablet (800 mg total) by mouth every 8 (eight) hours as needed for mild pain or moderate pain.   lisdexamfetamine 70 MG capsule Commonly known as:  VYVANSE Take 70 mg by mouth daily.   methocarbamol 500 MG tablet Commonly known as:  ROBAXIN Take 1 tablet (500 mg total) by mouth every 8 (eight) hours as needed for muscle spasms.   montelukast 10 MG tablet Commonly known as:  SINGULAIR TAKE 1 TABLET BY MOUTH EVERYDAY AT BEDTIME What changed:   See the new instructions.   MULTIVITAMIN GUMMIES ADULT PO Take 3 each by mouth at bedtime.   omeprazole 20 MG capsule Commonly known as:  PRILOSEC Take 1 capsule (20 mg total) by mouth daily. 30 days for gastroprotection while taking NSAIDs.   ondansetron 4 MG tablet Commonly known as:  ZOFRAN Take 1 tablet (4 mg total) by mouth every 8 (eight) hours as needed for nausea or vomiting.   OXYCONTIN 20 mg 12 hr tablet Generic drug:  oxyCODONE Take 20 mg by mouth every 12 (twelve) hours. What changed:  Another medication with the same name was added. Make sure you understand how and when to take each.   Oxycodone HCl 10 MG Tabs Take 10 mg by mouth 2 (two) times daily. What changed:  Another medication with the same name was added. Make sure you understand how and when to take each.   oxyCODONE 5 MG immediate release tablet Commonly known as:  Oxy IR/ROXICODONE Take 1 tablet (5 mg total) by mouth every 4 (four) hours as needed for breakthrough pain. What changed:  You were already taking a medication with the same name, and this prescription was added. Make sure you understand how and when to take each.   QUEtiapine 50 MG tablet Commonly known as:  SEROQUEL Take 200-500 mg by mouth See admin instructions. Take 200 mg by mouth in the morning and take 500 mg by mouth at bedtime   zolpidem 10 MG tablet Commonly known as:  AMBIEN Take 10 mg by mouth at bedtime.       Diagnostic Studies: Dg Shoulder Left Port  Result Date: 09/29/2018 CLINICAL DATA:  52 year old male post left shoulder surgery. Subsequent encounter. EXAM: LEFT SHOULDER - 1 VIEW COMPARISON:  04/15/2018 left shoulder CT. FINDINGS: Post total left shoulder replacement which appears in satisfactory position without complication noted on frontal projection. Left acromioclavicular joint degenerative changes. IMPRESSION: Post total left shoulder replacement. Electronically Signed   By: Genia Del M.D.   On: 09/29/2018 14:53     Disposition: Discharge disposition: 01-Home or Self Care       Discharge Instructions    Discharge patient   Complete by:  As directed    Discharge disposition:  01-Home or Self Care   Discharge patient date:  09/30/2018      Follow-up Information    Renette Butters, MD Follow up.   Specialty:  Orthopedic Surgery Contact information: [address] [place] [address] [phone]            Signed: Prudencio Burly III PA-C 09/30/2018, 7:20 AM

## 2018-09-30 NOTE — Progress Notes (Signed)
Subjective: Patient reports pain as mild -nerve block still in effect.  Tolerating diet.  Urinating.  No CP, SOB.  Mobilizing.  Objective:   VITALS:   Vitals:   09/29/18 1835 09/29/18 2126 09/30/18 0142 09/30/18 0533  BP: (!) 139/97 (!) 141/91 129/86 124/74  Pulse: (!) 113 (!) 104 92 87  Resp: 14 18 14 20   Temp: 97.6 F (36.4 C) 98.7 F (37.1 C) 98.1 F (36.7 C) 98.6 F (37 C)  TempSrc: Oral Oral Oral   SpO2: 95% 92% 94% 96%  Weight:      Height:       CBC Latest Ref Rng & Units 09/22/2018 04/09/2017 02/13/2017  WBC 4.0 - 10.5 K/uL 6.4 7.2 6.6  Hemoglobin 13.0 - 17.0 g/dL 15.0 15.9 16.3  Hematocrit 39.0 - 52.0 % 47.3 46.4 46.9  Platelets 150 - 400 K/uL 206 196.0 211.0   BMP Latest Ref Rng & Units 09/22/2018 11/22/2016 01/24/2011  Glucose 70 - 99 mg/dL 110(H) 113(H) 100(H)  BUN 6 - 20 mg/dL 25(H) 14 12  Creatinine 0.61 - 1.24 mg/dL 1.08 1.03 1.09  Sodium 135 - 145 mmol/L 140 138 137  Potassium 3.5 - 5.1 mmol/L 4.9 4.3 4.5  Chloride 98 - 111 mmol/L 104 98(L) 101  CO2 22 - 32 mmol/L 28 33(H) 25  Calcium 8.9 - 10.3 mg/dL 9.7 9.5 9.5   Intake/Output      11/05 0701 - 11/06 0700 11/06 0701 - 11/07 0700   P.O. 720    I.V. (mL/kg) 3154.5 (27.3)    IV Piggyback 300    Total Intake(mL/kg) 4174.5 (36.1)    Urine (mL/kg/hr) 1226    Stool 0    Blood 400    Total Output 1626    Net +2548.5         Urine Occurrence 0 x    Stool Occurrence 0 x       Physical Exam: General: NAD.  Supine in bed.  Asleep on arrival.  Sleepy but awakes and answers questions appropriately. Resp: No increased wob Cardio: regular rate and rhythm ABD soft Neurologically intact MSK LUE: Hand warm.  Gross sensation throughout including lateral shoulder.  Fine sensation still lacking mainly dorsal/radial hand. Wiggles fingers and is starting to open and close hand. Incision: dressing C/D/I  Assessment: 1 Day Post-Op  S/P Procedure(s) (LRB): LEFT TOTAL SHOULDER ARTHROPLASTY (Left) by Dr.  Ernesta Amble. Percell Miller on 09/29/2018  Principal Problem:   Primary osteoarthritis, left shoulder Active Problems:   History of depression   Chronic pain   Primary osteoarthritis, right shoulder   Attention deficit hyperactivity disorder (ADHD)   Bipolar II disorder (HCC)   Primary osteoarthritis of shoulder  Primary osteoarthritis left shoulder, status post anatomic total shoulder arthroplasty Doing well postop day 1 AFVSN Eating, drinking, and voiding Pain controlled, mild-Exparel block still in effect.  Takes OxyContin and oxycodone chronically.  Plan: Incentive Spirometry Maintain sling.  Apply ice  Weightbearing: NWB LUE Insicional and dressing care: Dressings left intact until follow-up Showering: Keep dressing dry VTE prophylaxis: Mobilize, SCDs, ambulation Pain control: Continue chronic medicines.  Oxycodone for breakthrough pain.  Toradol for inflammation.  Gabapentin, Tylenol scheduled.  Dispo: Home today in care of his wife.   Patient's anticipated LOS is less than 2 midnights, meeting these requirements: - Younger than 72 - Lives within 1 hour of care - Has a competent adult at home to recover with post-op recover - NO history of  - Diabetes  - Coronary  Artery Disease  - Heart failure  - Heart attack  - Stroke  - DVT/VTE  - Cardiac arrhythmia  - Respiratory Failure/COPD  - Renal failure  - Anemia  - Advanced Liver disease    Prudencio Burly III, PA-C 09/30/2018, 7:13 AM

## 2018-10-01 ENCOUNTER — Encounter (HOSPITAL_COMMUNITY): Payer: Self-pay | Admitting: Orthopedic Surgery

## 2018-12-16 ENCOUNTER — Other Ambulatory Visit: Payer: Self-pay | Admitting: Adult Health

## 2019-04-10 ENCOUNTER — Other Ambulatory Visit: Payer: Self-pay | Admitting: Adult Health

## 2019-06-21 ENCOUNTER — Other Ambulatory Visit: Payer: Self-pay | Admitting: Adult Health

## 2019-09-02 ENCOUNTER — Emergency Department (HOSPITAL_COMMUNITY)
Admission: EM | Admit: 2019-09-02 | Discharge: 2019-09-02 | Disposition: A | Discharge status: Home / Self Care | Payer: Medicare Other | Attending: Emergency Medicine | Admitting: Emergency Medicine

## 2019-09-02 ENCOUNTER — Emergency Department (HOSPITAL_COMMUNITY): Payer: Medicare Other

## 2019-09-02 ENCOUNTER — Other Ambulatory Visit: Payer: Self-pay

## 2019-09-02 ENCOUNTER — Encounter (HOSPITAL_COMMUNITY): Payer: Self-pay | Admitting: Emergency Medicine

## 2019-09-02 DIAGNOSIS — F909 Attention-deficit hyperactivity disorder, unspecified type: Secondary | ICD-10-CM | POA: Insufficient documentation

## 2019-09-02 DIAGNOSIS — R519 Headache, unspecified: Secondary | ICD-10-CM | POA: Diagnosis not present

## 2019-09-02 DIAGNOSIS — Z79899 Other long term (current) drug therapy: Secondary | ICD-10-CM | POA: Insufficient documentation

## 2019-09-02 DIAGNOSIS — R200 Anesthesia of skin: Secondary | ICD-10-CM | POA: Insufficient documentation

## 2019-09-02 DIAGNOSIS — R531 Weakness: Secondary | ICD-10-CM | POA: Diagnosis not present

## 2019-09-02 DIAGNOSIS — R4182 Altered mental status, unspecified: Secondary | ICD-10-CM | POA: Diagnosis present

## 2019-09-02 DIAGNOSIS — F319 Bipolar disorder, unspecified: Secondary | ICD-10-CM | POA: Insufficient documentation

## 2019-09-02 DIAGNOSIS — R400 Somnolence: Secondary | ICD-10-CM | POA: Diagnosis not present

## 2019-09-02 DIAGNOSIS — Z20828 Contact with and (suspected) exposure to other viral communicable diseases: Secondary | ICD-10-CM | POA: Diagnosis not present

## 2019-09-02 DIAGNOSIS — R479 Unspecified speech disturbances: Secondary | ICD-10-CM | POA: Insufficient documentation

## 2019-09-02 LAB — TSH: TSH: 1.02 u[IU]/mL (ref 0.350–4.500)

## 2019-09-02 LAB — AMMONIA: Ammonia: 55 umol/L — ABNORMAL HIGH (ref 9–35)

## 2019-09-02 LAB — RAPID URINE DRUG SCREEN, HOSP PERFORMED
Amphetamines: POSITIVE — AB
Barbiturates: NOT DETECTED
Benzodiazepines: NOT DETECTED
Cocaine: NOT DETECTED
Opiates: NOT DETECTED
Tetrahydrocannabinol: NOT DETECTED

## 2019-09-02 LAB — I-STAT CHEM 8, ED
BUN: 19 mg/dL (ref 6–20)
Calcium, Ion: 1.19 mmol/L (ref 1.15–1.40)
Chloride: 102 mmol/L (ref 98–111)
Creatinine, Ser: 0.9 mg/dL (ref 0.61–1.24)
Glucose, Bld: 112 mg/dL — ABNORMAL HIGH (ref 70–99)
HCT: 48 % (ref 39.0–52.0)
Hemoglobin: 16.3 g/dL (ref 13.0–17.0)
Potassium: 3.8 mmol/L (ref 3.5–5.1)
Sodium: 138 mmol/L (ref 135–145)
TCO2: 25 mmol/L (ref 22–32)

## 2019-09-02 LAB — URINALYSIS, ROUTINE W REFLEX MICROSCOPIC
Bilirubin Urine: NEGATIVE
Glucose, UA: 150 mg/dL — AB
Hgb urine dipstick: NEGATIVE
Ketones, ur: NEGATIVE mg/dL
Leukocytes,Ua: NEGATIVE
Nitrite: NEGATIVE
Protein, ur: NEGATIVE mg/dL
Specific Gravity, Urine: 1.029 (ref 1.005–1.030)
pH: 5 (ref 5.0–8.0)

## 2019-09-02 LAB — ETHANOL: Alcohol, Ethyl (B): <10 mg/dL (ref <10)

## 2019-09-02 LAB — CBG MONITORING, ED: Glucose-Capillary: 102 mg/dL — ABNORMAL HIGH (ref 70–99)

## 2019-09-02 MED ORDER — SODIUM CHLORIDE 0.9% FLUSH: 3.0000 mL | Freq: Once | INTRAVENOUS | Status: DC

## 2019-09-02 NOTE — ED Triage Notes (Signed)
Pt BIB [facility], LSN 2100 09/01/19. Wife reports pt has been complaining of a headache since waking up this morning, aphasia, and has been difficult to arouse. Pt c/o right arm numbness and left arm weakness that started last night. Pt only alert to self at this time. EMS VS: 185/103, HR 95, CBG 119
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
## 2019-09-02 NOTE — ED Notes (Signed)
Patient transported to MRI 

## 2019-09-02 NOTE — Discharge Instructions (Signed)
Your work-up today did not show evidence of stroke on the CT or MRI.  The neurology team did not feel you are having a stroke however they do feel is appropriate for you follow-up with a PCP and neurologist as an outpatient.  They recommended rest and staying hydrated.  They also felt it was appropriate for you to have a work-up as an outpatient for elevated glucose chronically with the intermittent numbness.

## 2019-09-02 NOTE — ED Provider Notes (Signed)
[facility] DEPARTMENT Provider Note   CSN: [id number] Arrival date & time: 09/02/19  1711     History   Chief Complaint Chief Complaint  Patient presents with   Altered Mental Status    HPI [name] Jenkins is a 53 y.o. male.     The history is provided by the patient and medical records. No language interpreter was used.  Neurologic Problem This is a new problem. The current episode started more than 2 days ago. The problem occurs constantly. The problem has not changed since onset.Associated symptoms include headaches. Pertinent negatives include no chest pain, no abdominal pain and no shortness of breath. Nothing aggravates the symptoms. Nothing relieves the symptoms. He has tried nothing for the symptoms. The treatment provided no relief.    Past Medical History:  Diagnosis Date   Anxiety    Arthritis    Depression    GERD (gastroesophageal reflux disease)     Patient Active Problem List   Diagnosis Date Noted   Primary osteoarthritis of shoulder 09/29/2018   Primary osteoarthritis, left shoulder 09/07/2018   Chronic pain 09/07/2018   Primary osteoarthritis, right shoulder 09/07/2018   Attention deficit hyperactivity disorder (ADHD) 09/07/2018   Bipolar II disorder ([place]) 09/07/2018   Chronic rhinitis 07/30/2018   Preoperative clearance 07/30/2018   Snoring 07/23/2017   Nocturnal hypoxia 05/20/2017   Encounter for screening colonoscopy 04/15/2017   Rectal bleeding 04/15/2017   Restrictive lung disease 04/09/2017   Hematochezia 04/09/2017   Dyspnea 01/16/2017   GERD (gastroesophageal reflux disease) 01/16/2017   Arthritis 01/16/2017   History of depression 01/16/2017    Past Surgical History:  Procedure Laterality Date   COLONOSCOPY     none     TOTAL SHOULDER ARTHROPLASTY Left 09/29/2018   Procedure: LEFT TOTAL SHOULDER ARTHROPLASTY;  Surgeon: Renette Butters, MD;  Location: [facility];  Service:  Orthopedics;  Laterality: Left;  with block        Home Medications    Prior to Admission medications   Medication Sig Start Date End Date Taking? Authorizing Provider  alprazolam Duanne Moron) 2 MG tablet Take 2 mg by mouth 2 (two) times daily.     [provider]  atorvastatin (LIPITOR) 20 MG tablet Take 20 mg by mouth at bedtime.     [provider]  divalproex (DEPAKOTE ER) 500 MG 24 hr tablet Take 1,000 mg by mouth at bedtime.     [provider]  docusate sodium (COLACE) 100 MG capsule Take 1 capsule (100 mg total) by mouth 2 (two) times daily. To prevent constipation while taking pain medication. 09/29/18   Prudencio Burly III, PA-C  ferrous sulfate 325 (65 FE) MG tablet Take 325 mg by mouth daily with breakfast.    [provider]  FLUoxetine (PROZAC) 40 MG capsule Take 80 mg by mouth daily.     [provider]  gabapentin (NEURONTIN) 300 MG capsule Take 1 capsule (300 mg total) by mouth 2 (two) times daily for 14 days. For 2 weeks post op for pain. 09/29/18 10/13/18  Prudencio Burly III, PA-C  lisdexamfetamine (VYVANSE) 70 MG capsule Take 70 mg by mouth daily.     [provider]  methocarbamol (ROBAXIN) 500 MG tablet Take 1 tablet (500 mg total) by mouth every 8 (eight) hours as needed for muscle spasms. 09/29/18   Prudencio Burly III, PA-C  montelukast (SINGULAIR) 10 MG tablet TAKE 1 TABLET BY MOUTH EVERYDAY AT BEDTIME 06/21/19  Parrett, Fonnie Mu, NP  Multiple Vitamins-Minerals (MULTIVITAMIN GUMMIES ADULT PO) Take 3 each by mouth at bedtime.    [provider]  omeprazole (PRILOSEC) 20 MG capsule Take 1 capsule (20 mg total) by mouth daily. 30 days for gastroprotection while taking NSAIDs. 09/29/18   Martensen, Charna Elizabeth III, PA-C  ondansetron (ZOFRAN) 4 MG tablet Take 1 tablet (4 mg total) by mouth every 8 (eight) hours as needed for nausea or vomiting. 09/29/18   Prudencio Burly III, PA-C    Oxycodone HCl 10 MG TABS Take 10 mg by mouth 2 (two) times daily.     [provider]  OXYCONTIN 20 MG 12 hr tablet Take 20 mg by mouth every 12 (twelve) hours.     [provider]  QUEtiapine (SEROQUEL) 50 MG tablet Take 200-500 mg by mouth See admin instructions. Take 200 mg by mouth in the morning and take 500 mg by mouth at bedtime    [provider]  zolpidem (AMBIEN) 10 MG tablet Take 10 mg by mouth at bedtime.  01/08/17   [provider]    Family History Family History  Problem Relation Age of Onset   Lung disease Mother    Lung cancer Father    Emphysema Maternal Grandfather    Heart disease Maternal Grandfather     Social History Social History   Tobacco Use   Smoking status: Never Smoker   Smokeless tobacco: Never Used  Substance Use Topics   Alcohol use: Yes    Comment: occ   Drug use: No     Allergies   Patient has no known allergies.   Review of Systems Review of Systems  Constitutional: Negative for chills, diaphoresis, fatigue and fever.  HENT: Negative for congestion.   Eyes: Positive for visual disturbance. Negative for photophobia.  Respiratory: Negative for cough, chest tightness and shortness of breath.   Cardiovascular: Negative for chest pain.  Gastrointestinal: Negative for abdominal pain, constipation, diarrhea, nausea and vomiting.  Musculoskeletal: Negative for back pain, neck pain and neck stiffness.  Skin: Negative for rash and wound.  Neurological: Positive for speech difficulty, weakness, light-headedness, numbness and headaches. Negative for dizziness and facial asymmetry.  Psychiatric/Behavioral: Negative for agitation and confusion.  All other systems reviewed and are negative.    Physical Exam Updated Vital Signs BP (!) 185/105    Pulse 93    Temp 98 F (36.7 C) (Oral)    Resp 16    SpO2 95%   Physical Exam Vitals signs and nursing note reviewed.  Constitutional:      General: He is  not in acute distress.    Appearance: He is well-developed. He is not ill-appearing, toxic-appearing or diaphoretic.  HENT:     Head: Normocephalic and atraumatic.     Nose: No congestion or rhinorrhea.     Mouth/Throat:     Pharynx: No oropharyngeal exudate or posterior oropharyngeal erythema.  Eyes:     General: No visual field deficit.    Extraocular Movements: Extraocular movements intact.     Conjunctiva/sclera: Conjunctivae normal.     Pupils: Pupils are equal, round, and reactive to light.  Neck:     Musculoskeletal: Neck supple. No muscular tenderness.  Cardiovascular:     Rate and Rhythm: Normal rate and regular rhythm.     Pulses: Normal pulses.     Heart sounds: No murmur.  Pulmonary:     Effort: Pulmonary effort is normal. No respiratory distress.  Breath sounds: Normal breath sounds. No wheezing, rhonchi or rales.  Chest:     Chest wall: No tenderness.  Abdominal:     General: Abdomen is flat.     Palpations: Abdomen is soft.     Tenderness: There is no abdominal tenderness. There is no right CVA tenderness or left CVA tenderness.  Musculoskeletal:        General: No tenderness.  Skin:    General: Skin is warm and dry.     Findings: No erythema.  Neurological:     General: No focal deficit present.     Mental Status: He is alert and oriented to person, place, and time.     GCS: GCS eye subscore is 4. GCS verbal subscore is 5. GCS motor subscore is 6.     Cranial Nerves: No cranial nerve deficit, dysarthria or facial asymmetry.     Sensory: Sensory deficit present.     Motor: Weakness present. No abnormal muscle tone or pronator drift.     Coordination: Coordination normal. Finger-Nose-Finger Test normal.     Comments: Subjective numbness in her right face, right arm, and right leg.  Weakness in right arm and right leg.  Subjective weakness in left grip strength.  Exam otherwise unremarkable.  Psychiatric:        Mood and Affect: Mood normal.      ED  Treatments / Results  Labs (all labs ordered are listed, but only abnormal results are displayed) Labs Reviewed  RAPID URINE DRUG SCREEN, HOSP PERFORMED - Abnormal; Notable for the following components:      Result Value   Amphetamines POSITIVE (*)    All other components within normal limits  URINALYSIS, ROUTINE W REFLEX MICROSCOPIC - Abnormal; Notable for the following components:   Glucose, UA 150 (*)    All other components within normal limits  AMMONIA - Abnormal; Notable for the following components:   Ammonia 55 (*)    All other components within normal limits  I-STAT CHEM 8, ED - Abnormal; Notable for the following components:   Glucose, Bld 112 (*)    All other components within normal limits  CBG MONITORING, ED - Abnormal; Notable for the following components:   Glucose-Capillary 102 (*)    All other components within normal limits  SARS CORONAVIRUS 2 (TAT 6-24 HRS)  ETHANOL  TSH  PROTIME-INR  APTT  CBC  DIFFERENTIAL  COMPREHENSIVE METABOLIC PANEL    EKG EKG Interpretation  Date/Time:  Thursday September 02 2019 17:17:32 EDT Ventricular Rate:  98 PR Interval:  136 QRS Duration: 86 QT Interval:  356 QTC Calculation: 454 R Axis:   22 Text Interpretation:  Normal sinus rhythm Normal ECG when compared to prior, no significant changes seen.  No STEMI Confirmed by Antony Blackbird [phone]) on 09/02/2019 6:04:42 PM   Radiology Ct Head Wo Contrast  Result Date: 09/02/2019 CLINICAL DATA:  Altered mental status EXAM: CT HEAD WITHOUT CONTRAST TECHNIQUE: Contiguous axial images were obtained from the base of the skull through the vertex without intravenous contrast. COMPARISON:  June 03, 2010 FINDINGS: Brain: No evidence of acute territorial infarction, hemorrhage, hydrocephalus,extra-axial collection or mass lesion/mass effect. Normal gray-white differentiation. Ventricles are normal in size and contour. Vascular: No hyperdense vessel or unexpected calcification. Skull: The  skull is intact. No fracture or focal lesion identified. Sinuses/Orbits: The visualized paranasal sinuses and mastoid air cells are clear. The orbits and globes intact. Other: None IMPRESSION: No acute intracranial abnormality. Electronically Signed   By: Kerby Moors  Avutu M.D.   On: 09/02/2019 19:32   Mr Brain Wo Contrast  Result Date: 09/02/2019 CLINICAL DATA:  Right-sided numbness. Aphasia. EXAM: MRI HEAD WITHOUT CONTRAST TECHNIQUE: Multiplanar, multiecho pulse sequences of the brain and surrounding structures were obtained without intravenous contrast. COMPARISON:  None. FINDINGS: BRAIN: There is no acute infarct, acute hemorrhage or extra-axial collection. The white matter signal is normal for the patient's age. The cerebral and cerebellar volume are age-appropriate. There is no hydrocephalus. The midline structures are normal. VASCULAR: The major intracranial arterial and venous sinus flow voids are normal. Susceptibility-sensitive sequences show no chronic microhemorrhage or superficial siderosis. SKULL AND UPPER CERVICAL SPINE: Calvarial bone marrow signal is normal. There is no skull base mass. The visualized upper cervical spine and soft tissues are normal. SINUSES/ORBITS: There are no fluid levels or advanced mucosal thickening. The mastoid air cells and middle ear cavities are free of fluid. The orbits are normal. IMPRESSION: Normal MRI of the brain. Electronically Signed   By: Ulyses Jarred M.D.   On: 09/02/2019 20:57    Procedures Procedures (including critical care time)  Medications Ordered in ED Medications  sodium chloride flush (NS) 0.9 % injection 3 mL (3 mLs Intravenous Not Given 09/02/19 1919)     Initial Impression / Assessment and Plan / ED Course  I have reviewed the triage vital signs and the nursing notes.  Pertinent labs & imaging results that were available during my care of the patient were reviewed by me and considered in my medical decision making (see chart for  details).        [name] Jenkins is a 53 y.o. male with a past medical history significant for GERD, bipolar 2, ADHD, osteoarthritis, and prior rectal bleeding who presents for 1 week of right sided numbness/weakness and 1 day of left hand weakness and difficulty speaking with somnolence.  Patient reports that for the last week he has had weakness and numbness develop in his right face, right arm, and right leg.  He says that last night around 11 PM he started having some headache, difficulty speaking, and left hand weakness when he was trying to clip his fingernails and he could not pinch the clippers.  He has never had stroke or TIA in the past.  He denies any fevers, chills, chest pain, shortness of breath, palpitations, nausea vomiting, urinary symptoms or GI symptoms.  Does report some blurry vision that has been intermittent.  He reports no recent medication changes or drug use.  Does report he is had months of some intermittent rectal bleeding and he has had prior colonoscopy for this and reports he has had prior hemorrhoids.  He denies any recent injuries.  He describes headache as moderate.  On exam, patient does have weakness in the right arm and right leg compared to the left.  He has numbness in the right leg, right arm, and right face.  No facial droop.  On my exam, speech is clear but patient is somnolent and sleepy.  Pupils are symmetric and reactive.  Left arm had weak grip strength but it was improved from the right.  Patient subjectively reports that his left grip strength is decreased on baseline.  Left arm did not appear weak.  Patient had no neck tenderness or stiffness on exam.  Lungs clear and chest nontender.  Abdomen nontender.  No other abnormalities on my exam.  As last normal was 1 week ago, will not make him a code stroke at this time.  CT  head emergently ordered.  Neurology will come to the patient and MRI will also be ordered.  Anticipate admission for neurologic deficits for  possible TIA versus stroke work-up.  10:27 PM Neurology recommended MRI after CT was reassuring.  MRI showed no stroke.  He feels that this is likely not a stroke causing his symptoms and they feel he is safe for outpatient discharge and follow-up with PCP and neurology.  He did feel that it was reasonable to have him get A1c checked as an outpatient although his glucose on arrival was 112.  Family agrees with discharge and patient be discharged for outpatient follow-up.     Final Clinical Impressions(s) / ED Diagnoses   Final diagnoses:  Numbness  Acute nonintractable headache, unspecified headache type  Somnolence  Transient speech disturbance    ED Discharge Orders    None      Clinical Impression: 1. Numbness   2. Acute nonintractable headache, unspecified headache type   3. Somnolence   4. Transient speech disturbance     Disposition: Discharge  Condition: Good  I have discussed the results, Dx and Tx plan with the pt(& family if present). He/she/they expressed understanding and agree(s) with the plan. Discharge instructions discussed at great length. Strict return precautions discussed and pt &/or family have verbalized understanding of the instructions. No further questions at time of discharge.    New Prescriptions   No medications on file    Follow Up: Chesley Noon, MD [place] [address] [facility] [address]     [place] [phone]       Kerwin Augustus, Gwenyth Allegra, MD 09/03/19 [id number]

## 2019-09-03 LAB — SARS CORONAVIRUS 2 (TAT 6-24 HRS): SARS Coronavirus 2: NEGATIVE

## 2019-09-12 ENCOUNTER — Other Ambulatory Visit: Payer: Self-pay | Admitting: Adult Health

## 2019-10-17 ENCOUNTER — Other Ambulatory Visit: Payer: Self-pay | Admitting: Adult Health

## 2019-11-26 DEATH — deceased
